# Patient Record
Sex: Female | Born: 1965 | Race: Black or African American | Hispanic: No | Smoking: Current some day smoker
Health system: Southern US, Community
[De-identification: ages and names within clinical notes are randomized; demographics above are authoritative.]

## PROBLEM LIST (undated history)

## (undated) DIAGNOSIS — M1990 Unspecified osteoarthritis, unspecified site: Secondary | ICD-10-CM

## (undated) DIAGNOSIS — I499 Cardiac arrhythmia, unspecified: Secondary | ICD-10-CM

## (undated) DIAGNOSIS — J302 Other seasonal allergic rhinitis: Secondary | ICD-10-CM

## (undated) DIAGNOSIS — M810 Age-related osteoporosis without current pathological fracture: Secondary | ICD-10-CM

## (undated) DIAGNOSIS — F32A Depression, unspecified: Secondary | ICD-10-CM

## (undated) DIAGNOSIS — K219 Gastro-esophageal reflux disease without esophagitis: Secondary | ICD-10-CM

## (undated) DIAGNOSIS — E785 Hyperlipidemia, unspecified: Secondary | ICD-10-CM

## (undated) DIAGNOSIS — K921 Melena: Secondary | ICD-10-CM

## (undated) DIAGNOSIS — I959 Hypotension, unspecified: Secondary | ICD-10-CM

## (undated) DIAGNOSIS — R29898 Other symptoms and signs involving the musculoskeletal system: Secondary | ICD-10-CM

## (undated) DIAGNOSIS — R011 Cardiac murmur, unspecified: Secondary | ICD-10-CM

## (undated) DIAGNOSIS — T7840XA Allergy, unspecified, initial encounter: Secondary | ICD-10-CM

## (undated) DIAGNOSIS — D649 Anemia, unspecified: Secondary | ICD-10-CM

## (undated) DIAGNOSIS — F419 Anxiety disorder, unspecified: Secondary | ICD-10-CM

## (undated) DIAGNOSIS — F329 Major depressive disorder, single episode, unspecified: Secondary | ICD-10-CM

## (undated) DIAGNOSIS — K649 Unspecified hemorrhoids: Secondary | ICD-10-CM

## (undated) DIAGNOSIS — G709 Myoneural disorder, unspecified: Secondary | ICD-10-CM

## (undated) DIAGNOSIS — F1910 Other psychoactive substance abuse, uncomplicated: Secondary | ICD-10-CM

## (undated) DIAGNOSIS — M419 Scoliosis, unspecified: Secondary | ICD-10-CM

## (undated) HISTORY — DX: Allergy, unspecified, initial encounter: T78.40XA

## (undated) HISTORY — DX: Scoliosis, unspecified: M41.9

## (undated) HISTORY — DX: Unspecified osteoarthritis, unspecified site: M19.90

## (undated) HISTORY — DX: Other seasonal allergic rhinitis: J30.2

## (undated) HISTORY — DX: Gastro-esophageal reflux disease without esophagitis: K21.9

## (undated) HISTORY — DX: Anxiety disorder, unspecified: F41.9

## (undated) HISTORY — DX: Anemia, unspecified: D64.9

## (undated) HISTORY — DX: Myoneural disorder, unspecified: G70.9

## (undated) HISTORY — DX: Other psychoactive substance abuse, uncomplicated: F19.10

## (undated) HISTORY — DX: Melena: K92.1

## (undated) HISTORY — DX: Unspecified hemorrhoids: K64.9

## (undated) HISTORY — PX: WISDOM TOOTH EXTRACTION: SHX21

## (undated) HISTORY — DX: Hyperlipidemia, unspecified: E78.5

## (undated) HISTORY — DX: Depression, unspecified: F32.A

## (undated) HISTORY — DX: Major depressive disorder, single episode, unspecified: F32.9

## (undated) HISTORY — PX: TUBAL LIGATION: SHX77

## (undated) HISTORY — PX: PATELLA FRACTURE SURGERY: SHX735

## (undated) HISTORY — DX: Cardiac arrhythmia, unspecified: I49.9

## (undated) HISTORY — DX: Hypotension, unspecified: I95.9

## (undated) HISTORY — DX: Cardiac murmur, unspecified: R01.1

## (undated) HISTORY — DX: Age-related osteoporosis without current pathological fracture: M81.0

## (undated) HISTORY — PX: REPLACEMENT TOTAL HIP W/  RESURFACING IMPLANTS: SUR1222

---

## 2004-03-23 ENCOUNTER — Encounter (HOSPITAL_COMMUNITY): Admission: RE | Admit: 2004-03-23 | Discharge: 2004-04-22 | Payer: Self-pay | Admitting: Preventative Medicine

## 2004-11-27 ENCOUNTER — Emergency Department (HOSPITAL_COMMUNITY): Admission: EM | Admit: 2004-11-27 | Discharge: 2004-11-27 | Payer: Self-pay | Admitting: Emergency Medicine

## 2008-01-28 ENCOUNTER — Emergency Department (HOSPITAL_COMMUNITY): Admission: EM | Admit: 2008-01-28 | Discharge: 2008-01-28 | Payer: Self-pay | Admitting: Emergency Medicine

## 2008-06-08 ENCOUNTER — Emergency Department (HOSPITAL_COMMUNITY): Admission: EM | Admit: 2008-06-08 | Discharge: 2008-06-08 | Payer: Self-pay | Admitting: Emergency Medicine

## 2008-06-16 ENCOUNTER — Emergency Department (HOSPITAL_COMMUNITY): Admission: EM | Admit: 2008-06-16 | Discharge: 2008-06-16 | Payer: Self-pay | Admitting: Emergency Medicine

## 2009-08-18 ENCOUNTER — Emergency Department (HOSPITAL_COMMUNITY): Admission: EM | Admit: 2009-08-18 | Discharge: 2009-08-18 | Payer: Self-pay | Admitting: Family Medicine

## 2010-03-03 ENCOUNTER — Emergency Department (HOSPITAL_COMMUNITY): Admission: EM | Admit: 2010-03-03 | Discharge: 2010-03-03 | Payer: Self-pay | Admitting: Family Medicine

## 2010-04-05 ENCOUNTER — Emergency Department (HOSPITAL_COMMUNITY): Admission: EM | Admit: 2010-04-05 | Discharge: 2010-04-05 | Payer: Self-pay | Admitting: Emergency Medicine

## 2010-06-14 ENCOUNTER — Emergency Department (HOSPITAL_COMMUNITY): Admission: EM | Admit: 2010-06-14 | Discharge: 2010-06-14 | Payer: Self-pay | Admitting: Family Medicine

## 2011-05-23 LAB — POCT RAPID STREP A: Streptococcus, Group A Screen (Direct): NEGATIVE

## 2013-06-17 ENCOUNTER — Emergency Department (INDEPENDENT_AMBULATORY_CARE_PROVIDER_SITE_OTHER)
Admission: EM | Admit: 2013-06-17 | Discharge: 2013-06-17 | Disposition: A | Discharge status: Home / Self Care | Payer: Self-pay | Source: Home / Self Care | Attending: Emergency Medicine | Admitting: Emergency Medicine

## 2013-06-17 ENCOUNTER — Encounter (HOSPITAL_COMMUNITY): Payer: Self-pay | Admitting: Emergency Medicine

## 2013-06-17 DIAGNOSIS — S139XXA Sprain of joints and ligaments of unspecified parts of neck, initial encounter: Secondary | ICD-10-CM

## 2013-06-17 DIAGNOSIS — S161XXA Strain of muscle, fascia and tendon at neck level, initial encounter: Secondary | ICD-10-CM

## 2013-06-17 DIAGNOSIS — S39012A Strain of muscle, fascia and tendon of lower back, initial encounter: Secondary | ICD-10-CM

## 2013-06-17 HISTORY — DX: Other symptoms and signs involving the musculoskeletal system: R29.898

## 2013-06-17 MED ORDER — OXYCODONE-ACETAMINOPHEN 5-325 MG PO TABS: ORAL_TABLET | ORAL | Status: DC

## 2013-06-17 MED ORDER — MELOXICAM 15 MG PO TABS: 15.0000 mg | ORAL_TABLET | Freq: Every day | ORAL | Status: DC

## 2013-06-17 MED ORDER — METHOCARBAMOL 500 MG PO TABS: 500.0000 mg | ORAL_TABLET | Freq: Three times a day (TID) | ORAL | Status: DC
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
## 2014-02-22 ENCOUNTER — Emergency Department (HOSPITAL_COMMUNITY)
Admission: EM | Admit: 2014-02-22 | Discharge: 2014-02-22 | Disposition: A | Discharge status: Home / Self Care | Payer: BC Managed Care – PPO | Attending: Emergency Medicine | Admitting: Emergency Medicine

## 2014-02-22 ENCOUNTER — Encounter (HOSPITAL_COMMUNITY): Payer: Self-pay | Admitting: Emergency Medicine

## 2014-02-22 DIAGNOSIS — M79609 Pain in unspecified limb: Secondary | ICD-10-CM | POA: Insufficient documentation

## 2014-02-22 DIAGNOSIS — M79671 Pain in right foot: Secondary | ICD-10-CM

## 2014-02-22 DIAGNOSIS — Z791 Long term (current) use of non-steroidal anti-inflammatories (NSAID): Secondary | ICD-10-CM | POA: Insufficient documentation

## 2014-02-22 DIAGNOSIS — F17200 Nicotine dependence, unspecified, uncomplicated: Secondary | ICD-10-CM | POA: Insufficient documentation

## 2014-02-22 DIAGNOSIS — Z79899 Other long term (current) drug therapy: Secondary | ICD-10-CM | POA: Insufficient documentation

## 2014-02-22 MED ORDER — NAPROXEN 500 MG PO TABS: 500.0000 mg | ORAL_TABLET | Freq: Two times a day (BID) | ORAL | Status: DC

## 2014-02-22 NOTE — ED Provider Notes (Signed)
CSN: [id number]     Arrival date & time 02/22/14  1405 History   First MD Initiated Contact with Patient 02/22/14 1608     Chief Complaint  Patient presents with  . Foot Pain   HPI  Toni Kennedy is a 48 y.o. female with a PMH of CHF who presents to the ED for evaluation of right foot pain. History was provided by the patient. Patient states she has had right foot pain for the past 2-3 weeks. Pain is present on the right lateral foot. Patient went to an UC and had an x-ray which showed a bone spur and flat arches. Pain is a constant sharp pain, which is worse with ambulation. Patient was prescribed robaxin which has not been relieving her pain. She states she has had localized intermittent swelling to the area, but denies this currently. She was given a boot, but has only been wearing it intermittently. Patient denies any injuries or trauma. No numbness, tingling, loss of sensation, or weakness. No calf pain/edema. No fevers, chills, SOB, chest pain or other concerns. Patient has an orthopedic specialist Dr. Alfonso Ramus. Hx of foot injury on the left. No hx of injury on the right.     Past Medical History  Diagnosis Date  . Back complaints    History reviewed. No pertinent past surgical history. No family history on file. History  Substance Use Topics  . Smoking status: Current Every Day Smoker    Types: Cigars  . Smokeless tobacco: Not on file  . Alcohol Use: No   OB History   Grav Para Term Preterm Abortions TAB SAB Ect Mult Living                  Review of Systems  Constitutional: Negative for fever, chills, activity change, appetite change and fatigue.  Respiratory: Negative for shortness of breath.   Cardiovascular: Negative for chest pain and leg swelling.  Musculoskeletal: Positive for arthralgias (foot pain) and joint swelling (see HPI). Negative for back pain, gait problem and neck pain.  Skin: Negative for color change and wound.  Neurological: Negative for weakness and  numbness.    Allergies  Sulfa antibiotics and Tetracyclines & related  Home Medications   Prior to Admission medications   Medication Sig Start Date End Date Taking? Authorizing Provider  meloxicam (MOBIC) 15 MG tablet Take 1 tablet (15 mg total) by mouth daily. 06/17/13   Harden Mo, MD  methocarbamol (ROBAXIN) 500 MG tablet Take 1 tablet (500 mg total) by mouth 3 (three) times daily. 06/17/13   Harden Mo, MD  oxyCODONE-acetaminophen (PERCOCET) 5-325 MG per tablet 1 to 2 tablets every 6 hours as needed for pain. 06/17/13   Harden Mo, MD   BP 127/74  Pulse 82  Temp(Src) 98.4 F (36.9 C) (Oral)  Resp 20  SpO2 99%  LMP 02/01/2014  Filed Vitals:   02/22/14 1411 02/22/14 1633  BP: 127/74 126/64  Pulse: 82 60  Temp: 98.4 F (36.9 C) 98.5 F (36.9 C)  TempSrc: Oral Oral  Resp: 20 18  SpO2: 99% 100%    Physical Exam  Nursing note and vitals reviewed. Constitutional: She is oriented to person, place, and time. She appears well-developed and well-nourished. No distress.  HENT:  Head: Normocephalic and atraumatic.  Right Ear: External ear normal.  Left Ear: External ear normal.  Nose: Nose normal.  Mouth/Throat: Oropharynx is clear and moist.  Eyes: Conjunctivae are normal. Right eye exhibits no discharge. Left  eye exhibits no discharge.  Neck: Normal range of motion. Neck supple.  Cardiovascular: Normal rate, regular rhythm, normal heart sounds and intact distal pulses.  Exam reveals no gallop and no friction rub.   No murmur heard. Dorsalis pedis pulses present and equal bilaterally  Pulmonary/Chest: Effort normal and breath sounds normal. No respiratory distress. She has no wheezes. She has no rales. She exhibits no tenderness.  Abdominal: Soft.  Musculoskeletal: Normal range of motion. She exhibits no edema and no tenderness.  No tenderness to palpation to the right foot or ankle throughout. Patient reports pain to her right lateral foot with weight bearing  and ambulation. Pes planus. Hallux valgus on the left. No edema to the feet, calves or legs bilaterally. No wounds, erythema, ecchymosis to the feet or LE throughout. ROM intact in the LE. Patient able to ambulate without difficulty or ataxia  Neurological: She is alert and oriented to person, place, and time.  Sensation intact in the LE bilaterally  Skin: Skin is warm and dry. She is not diaphoretic.     ED Course  Procedures (including critical care time) Labs Review Labs Reviewed - No data to display  Imaging Review No results found.   EKG Interpretation None      MDM   Toni Kennedy is a 48 y.o. female with a PMH of CHF who presents to the ED for evaluation of right foot pain. Foot pain possibly due to bone spur vs arthritis vs pes planus. Patient had x-rays of her foot done PTA at an UC clinic. No hx of injuries or trauma. Also given boot PTA at [facility] clinic. Patient wearing flip flops in the ED. Advised to wear better supportive shoes and or boot. Patient neurovascularly intact. No evidence of an infectious process. Patient afebrile and non-toxic in appearance. RICE method discussed. Advised patient to follow-up with her orthopedic specialist. Return precautions, discharge instructions, and follow-up was discussed with the patient before discharge.      Discharge Medication List as of 02/22/2014  4:17 PM    START taking these medications   Details  naproxen (NAPROSYN) 500 MG tablet Take 1 tablet (500 mg total) by mouth 2 (two) times daily with a meal., Starting 02/22/2014, Until Discontinued, Print         Final impressions: 1. Right foot pain       Harold Hedge [place], [place] 02/23/14 [phone]

## 2014-02-22 NOTE — Discharge Instructions (Signed)
Use RICE method - see below  Take naprosyn twice daily with food for pain Continue to wear your boot  Return to the emergency department if you develop any changing/worsening condition, fever, leg swelling, spreading redness/swelling or any other concerns (please read additional information regarding your condition below)    Foot Pain  A foot contusion is a deep bruise to the foot. Contusions are the result of an injury that caused bleeding under the skin. The contusion may turn blue, purple, or yellow. Minor injuries will give you a painless contusion, but more severe contusions may stay painful and swollen for a few weeks. CAUSES  A foot contusion comes from a direct blow to that area, such as a heavy object falling on the foot. SYMPTOMS   Swelling of the foot.  Discoloration of the foot.  Tenderness or soreness of the foot. DIAGNOSIS  You will have a physical exam and will be asked about your history. You may need an X-ray of your foot to look for a broken bone (fracture).  TREATMENT  An elastic wrap may be recommended to support your foot. Resting, elevating, and applying cold compresses to your foot are often the best treatments for a foot contusion. Over-the-counter medicines may also be recommended for pain control. HOME CARE INSTRUCTIONS   Put ice on the injured area.  Put ice in a plastic bag.  Place a towel between your skin and the bag.  Leave the ice on for 15-20 minutes, 03-04 times a day.  Only take over-the-counter or prescription medicines for pain, discomfort, or fever as directed by your caregiver.  If told, use an elastic wrap as directed. This can help reduce swelling. You may remove the wrap for sleeping, showering, and bathing. If your toes become numb, cold, or blue, take the wrap off and reapply it more loosely.  Elevate your foot with pillows to reduce swelling.  Try to avoid standing or walking while the foot is painful. Do not resume use until  instructed by your caregiver. Then, begin use gradually. If pain develops, decrease use. Gradually increase activities that do not cause discomfort until you have normal use of your foot.  See your caregiver as directed. It is very important to keep all follow-up appointments in order to avoid any lasting problems with your foot, including long-term (chronic) pain. SEEK IMMEDIATE MEDICAL CARE IF:   You have increased redness, swelling, or pain in your foot.  Your swelling or pain is not relieved with medicines.  You have loss of feeling in your foot or are unable to move your toes.  Your foot turns cold or blue.  You have pain when you move your toes.  Your foot becomes warm to the touch.  Your contusion does not improve in 2 days. MAKE SURE YOU:   Understand these instructions.  Will watch your condition.  Will get help right away if you are not doing well or get worse. Document Released: 05/29/2006 Document Revised: 02/06/2012 Document Reviewed: 07/11/2011 [facility] Patient Information 2015 [place], [place]. This information is not intended to replace advice given to you by your health care provider. Make sure you discuss any questions you have with your health care provider.  RICE: Routine Care for Injuries The routine care of many injuries includes Rest, Ice, Compression, and Elevation (RICE). HOME CARE INSTRUCTIONS  Rest is needed to allow your body to heal. Routine activities can usually be resumed when comfortable. Injured tendons and bones can take up to 6 weeks to  heal. Tendons are the cord-like structures that attach muscle to bone.  Ice following an injury helps keep the swelling down and reduces pain.  Put ice in a plastic bag.  Place a towel between your skin and the bag.  Leave the ice on for 15-20 minutes, 3-4 times a day, or as directed by your health care provider. Do this while awake, for the first 24 to 48 hours. After that, continue as directed by your  caregiver.  Compression helps keep swelling down. It also gives support and helps with discomfort. If an elastic bandage has been applied, it should be removed and reapplied every 3 to 4 hours. It should not be applied tightly, but firmly enough to keep swelling down. Watch fingers or toes for swelling, bluish discoloration, coldness, numbness, or excessive pain. If any of these problems occur, remove the bandage and reapply loosely. Contact your caregiver if these problems continue.  Elevation helps reduce swelling and decreases pain. With extremities, such as the arms, hands, legs, and feet, the injured area should be placed near or above the level of the heart, if possible. SEEK IMMEDIATE MEDICAL CARE IF:  You have persistent pain and swelling.  You develop redness, numbness, or unexpected weakness.  Your symptoms are getting worse rather than improving after several days. These symptoms may indicate that further evaluation or further X-rays are needed. Sometimes, X-rays may not show a small broken bone (fracture) until 1 week or 10 days later. Make a follow-up appointment with your caregiver. Ask when your X-ray results will be ready. Make sure you get your X-ray results. Document Released: 11/19/2000 Document Revised: 08/12/2013 Document Reviewed: 01/06/2011 [facility] Patient Information 2015 [place], [place]. This information is not intended to replace advice given to you by your health care provider. Make sure you discuss any questions you have with your health care provider.  Flat Feet Having flat feet is a common condition. One foot or both might be affected. People of any age can have flat feet. In fact, everyone is born with them. But most of the time, the foot gradually develops an arch. That is the curve on the bottom of the foot that creates a gap between the foot and the ground. An arch usually develops in childhood. Sometimes, though, an arch never develops and the foot stays flat on  the bottom. Other times, an arch develops but later collapses (caves in). That is what gives the condition its nickname, "fallen arches." The medical term for flat feet is pes planus. Some people have flat feet their whole life and have no problems. For others, the condition causes pain and needs to be corrected.  CAUSES   A problem with the foot's soft tissue; tendons and ligaments could be loose.  This can cause what is called flexible flat feet. That means the shape of the foot changes with pressure. When standing on the toes, a curved arch can be seen. When standing on the ground, the foot is flat.  Wear and tear. Sometimes arches simply flatten over time.  Damage to the posterior tibial tendon. This is the tendon that goes from the inside of the ankle to the bones in the middle of the foot. It is the main support for the arch. If the tendon is injured, stretched or torn, the arch might flatten.  Tarsal coalition. With this condition, two or more bones in the foot are joined together (fused ) during development in the womb. This limits movement and can lead to  a flat foot. SYMPTOMS   The foot is even with the ground from toe to heel. Your caregiver will look closely at the inside of the foot while you are standing.  Pain along the bottom of the foot. Some people describe the pain as tightness.  Swelling on the inside of the foot or ankle.  Changes in the way you walk (gait).  The feet lean inward, starting at the ankle (pronation). DIAGNOSIS  To decide if a child or adult has flat feet, a healthcare provider will probably:  Do a physical examination. This might include having the person stand on his or her toes and then stand normally. The caregiver will also hold the foot and put pressure on the foot in different directions.  Check the person's shoes. The pattern of wear on the soles can offer clues.  Order images (pictures) of the foot. They can help identify the cause of any  pain. They also will show injuries to bones or tendons that could be causing the condition. The images can come from:  X-rays.  Computed tomography (CT) scan. This combines X-ray and a computer.  Magnetic resonance imaging (MRI). This uses magnets, radio waves and a computer to take a picture of the foot. It is the best technique to evaluate tendons, ligaments and muscles. TREATMENT   Flexible flat feet usually are painless. Most of the time, gait is not affected. Most children grow out of the condition. Often no treatment is needed. If there is pain, treatment options include:  Orthotics. These are inserts that go in the shoes. They add support and shape to the feet. An orthotic is custom-made from a mold of the foot.  Shoes. Not all shoes are the same. People with flat feet need arch support. However, too much can be painful. It is important to find shoes that offer the right amount of support. Athletes, especially runners, may need to try shoes made just for people with flatter feet.  Medication. For pain, only take over-the-counter medicine for pain, discomfort, as directed by your caregiver.  Rest. If the feet start to hurt, cut back on the exercise which increases the pain. Use common sense.  For damage to the posterior tibial tendon, options include:  Orthotics. Also adding a wedge on the inside edge may help. This can relieve pressure on the tendon.  Ankle brace, boot or cast. These supports can ease the load on the tendon while it heals.  Surgery. If the tendon is torn, it might need to be repaired.  For tarsal coalition, similar options apply:  Pain medication.  Orthotics.  A cast and crutches. This keeps weight off the foot.  Physical therapy.  Surgery to remove the bone bridge joining the two bones together. PROGNOSIS  In most people, flat feet do not cause pain or problems. People can go about their normal activities. However, if flat feet are painful, they can and  should be treated. Treatment usually relieves the pain. HOME CARE INSTRUCTIONS   Take any medications prescribed by the healthcare provider. Follow the directions carefully.  Wear, or make sure a child wears, orthotics or special shoes if this was suggested. Be sure to ask how often and for how long they should be worn.  Do any exercises or therapy treatments that were suggested.  Take notes on when the pain occurs. This will help healthcare providers decide how to treat the condition.  If surgery is needed, be sure to find out if there is anything  that should or should not be done before the operation. SEEK MEDICAL CARE IF:   Pain worsens in the foot or lower leg.  Pain disappears after treatment, but then returns.  Walking or simple exercise becomes difficult or causes foot pain.  Orthotics or special shoes are uncomfortable or painful. Document Released: 06/04/2009 Document Revised: 10/30/2011 Document Reviewed: 06/04/2009 [facility] Patient Information 2015 [place], [place]. This information is not intended to replace advice given to you by your health care provider. Make sure you discuss any questions you have with your health care provider.  Bunion (Hallux Valgus) A bony bump (protrusion) on the inside of the foot, at the base of the first toe, is called a bunion (hallux valgus). A bunion causes the first toe to angle toward the other toes. SYMPTOMS   A bony bump on the inside of the foot, causing an outward turning of the first toe. It may also overlap the second toe.  Thickening of the skin (callus) over the bony bump.  Fluid buildup under the callus. Fluid may become red, tender, and swollen (inflamed) with constant irritation or pressure.  Foot pain and stiffness. CAUSES  Many causes exist, including:  Inherited from your family (genetics).  Injury (trauma) forcing the first toe into a position in which it overlaps other toes.  Bunions are also associated with wearing  shoes that have a narrow toe box (pointy shoes). RISK INCREASES WITH:  Family history of foot abnormalities, especially bunions.  Arthritis.  Narrow shoes, especially high heels. PREVENTION  Wear shoes with a wide toe box.  Avoid shoes with high heels.  Wear a small pad between the big toe and second toe.  Maintain proper conditioning:  Foot and ankle flexibility.  Muscle strength and endurance. PROGNOSIS  With proper treatment, bunions can typically be cured. Occasionally, surgery is required.  RELATED COMPLICATIONS   Infection of the bunion.  Arthritis of the first toe.  Risks of surgery, including infection, bleeding, injury to nerves (numb toe), recurrent bunion, overcorrection (toe points inward), arthritis of the big toe, big toe pointing upward, and bone not healing. TREATMENT  Treatment first consists of stopping the activities that aggravate the pain, taking pain medicines, and icing to reduce inflammation and pain. Wear shoes with a wide toe box. Shoes can be modified by a shoe repair person to relieve pressure on the bunion, especially if you cannot find shoes with a wide enough toe box. You may also place a pad with the center cut out in your shoe, to reduce pressure on the bunion. Sometimes, an arch support (orthotic) may reduce pressure on the bunion and alleviate the symptoms. Stretching and strengthening exercises for the muscles of the foot may be useful. You may choose to wear a brace or pad at night to hold the big toe away from the second toe. If non-surgical treatments are not successful, surgery may be needed. Surgery involves removing the overgrown tissue and correcting the position of the first toe, by realigning the bones. Bunion surgery is typically performed on an outpatient basis, meaning you can go home the same day as surgery. The surgery may involve cutting the mid portion of the bone of the first toe, or just cutting and repairing (reconstructing) the  ligaments and soft tissues around the first toe.  MEDICATION   If pain medicine is needed, nonsteroidal anti-inflammatory medicines, such as aspirin and ibuprofen, or other minor pain relievers, such as acetaminophen, are often recommended.  Do not take pain medicine for  7 days before surgery.  Prescription pain relievers are usually only prescribed after surgery. Use only as directed and only as much as you need.  Ointments applied to the skin may be helpful. HEAT AND COLD  Cold treatment (icing) relieves pain and reduces inflammation. Cold treatment should be applied for 10 to 15 minutes every 2 to 3 hours for inflammation and pain and immediately after any activity that aggravates your symptoms. Use ice packs or an ice massage.  Heat treatment may be used prior to performing the stretching and strengthening activities prescribed by your caregiver, physical therapist, or athletic trainer. Use a heat pack or a warm soak. SEEK MEDICAL CARE IF:   Symptoms get worse or do not improve in 2 weeks, despite treatment.  After surgery, you develop fever, increasing pain, redness, swelling, drainage of fluids, bleeding, or increasing warmth around the surgical area.  New, unexplained symptoms develop. (Drugs used in treatment may produce side effects.) Document Released: 08/07/2005 Document Revised: 10/30/2011 Document Reviewed: 11/19/2008 [facility] Patient Information 2015 [place], [place]. This information is not intended to replace advice given to you by your health care provider. Make sure you discuss any questions you have with your health care provider.

## 2014-02-22 NOTE — ED Notes (Signed)
Pt.stated, the last 2-3 weeks my rt. Foot has been painful.Toni Kennedy saw Toni Justin, PA and given Melexican and not helping. They said its bone spurs and the arch in my foot. Given me a foot boot.

## 2014-02-26 NOTE — ED Provider Notes (Signed)
  Medical screening examination/treatment/procedure(s) were performed by non-physician practitioner and as supervising physician I was immediately available for consultation/collaboration.   EKG Interpretation None         Carmin Muskrat, MD 02/26/14 [phone]

## 2014-05-07 ENCOUNTER — Emergency Department (INDEPENDENT_AMBULATORY_CARE_PROVIDER_SITE_OTHER)
Admission: EM | Admit: 2014-05-07 | Discharge: 2014-05-07 | Disposition: A | Discharge status: Home / Self Care | Payer: BC Managed Care – PPO | Source: Home / Self Care

## 2014-05-07 ENCOUNTER — Encounter (HOSPITAL_COMMUNITY): Payer: Self-pay | Admitting: Emergency Medicine

## 2014-05-07 DIAGNOSIS — H0013 Chalazion right eye, unspecified eyelid: Secondary | ICD-10-CM

## 2014-05-07 DIAGNOSIS — H0019 Chalazion unspecified eye, unspecified eyelid: Secondary | ICD-10-CM

## 2014-05-07 MED ORDER — ERYTHROMYCIN 5 MG/GM OP OINT: TOPICAL_OINTMENT | OPHTHALMIC | Status: DC

## 2014-05-07 NOTE — Discharge Instructions (Signed)
Chalazion A chalazion is a swelling or hard lump on the eyelid caused by a blocked oil gland. Chalazions may occur on the upper or the lower eyelid.  CAUSES  Oil gland in the eyelid becomes blocked. SYMPTOMS   Swelling or hard lump on the eyelid. This lump may make it hard to see out of the eye.  The swelling may spread to areas around the eye. TREATMENT   Although some chalazions disappear by themselves in 1 or 2 months, some chalazions may need to be removed.  Medicines to treat an infection may be required. HOME CARE INSTRUCTIONS   Wash your hands often and dry them with a clean towel. Do not touch the chalazion.  Apply heat to the eyelid several times a day for 10 minutes to help ease discomfort and bring any yellowish white fluid (pus) to the surface. One way to apply heat to a chalazion is to use the handle of a metal spoon.  Hold the handle under hot water until it is hot, and then wrap the handle in paper towels so that the heat can come through without burning your skin.  Hold the wrapped handle against the chalazion and reheat the spoon handle as needed.  Apply heat in this fashion for 10 minutes, 4 times per day.  Return to your caregiver to have the pus removed if it does not break (rupture) on its own.  Do not try to remove the pus yourself by squeezing the chalazion or sticking it with a pin or needle.  Only take over-the-counter or prescription medicines for pain, discomfort, or fever as directed by your caregiver. SEEK IMMEDIATE MEDICAL CARE IF:   You have pain in your eye.  Your vision changes.  The chalazion does not go away.  The chalazion becomes painful, red, or swollen, grows larger, or does not start to disappear after 2 weeks. MAKE SURE YOU:   Understand these instructions.  Will watch your condition.  Will get help right away if you are not doing well or get worse. Document Released: 08/04/2000 Document Revised: 10/30/2011 Document Reviewed:  11/22/2009 [employer] Patient Information 2015 [employer]. This information is not intended to replace advice given to you by your health care provider. Make sure you discuss any questions you have with your health care provider.  

## 2014-05-07 NOTE — ED Notes (Signed)
Reports 3 days ago, she thought she got something in eye, rubbed eye a lot.  Since then, she has noticed a bump on lower, right lid.  Patient reports pulling lid down and seeing a yellow pocket, reports on the way here bump did burst.  Bump remains visible below right eye

## 2014-05-07 NOTE — ED Provider Notes (Signed)
CSN: [id number]     Arrival date & time 05/07/14  1009 History   First MD Initiated Contact with Patient 05/07/14 1128     Chief Complaint  Patient presents with  . Eye Problem   (Consider location/radiation/quality/duration/timing/severity/associated sxs/prior Treatment) HPI Comments: 2-3 d ago experienced a FB sensation to the OD. She has been rubbing it and noticed a bump in the lower lid near the outer canthus.  She notes it temporarily drained prior to arrival to [facility].   Past Medical History  Diagnosis Date  . Back complaints    Past Surgical History  Procedure Laterality Date  . Cesarean section    . Tubal ligation    . Knee surgery     No family history on file. History  Substance Use Topics  . Smoking status: Current Every Day Smoker    Types: Cigars  . Smokeless tobacco: Not on file  . Alcohol Use: No   OB History   Grav Para Term Preterm Abortions TAB SAB Ect Mult Living                 Review of Systems  Constitutional: Negative.   HENT: Negative.   Eyes: Positive for pain. Negative for photophobia, discharge, redness and itching.  Respiratory: Negative.     Allergies  Sulfa antibiotics and Tetracyclines & related  Home Medications   Prior to Admission medications   Medication Sig Start Date End Date Taking? Authorizing Provider  meloxicam (MOBIC) 15 MG tablet Take 1 tablet (15 mg total) by mouth daily. 06/17/13   Harden Mo, MD  methocarbamol (ROBAXIN) 500 MG tablet Take 1 tablet (500 mg total) by mouth 3 (three) times daily. 06/17/13   Harden Mo, MD  naproxen (NAPROSYN) 500 MG tablet Take 1 tablet (500 mg total) by mouth 2 (two) times daily with a meal. 02/22/14   Lucila [place], PA-C  oxyCODONE-acetaminophen (PERCOCET) 5-325 MG per tablet 1 to 2 tablets every 6 hours as needed for pain. 06/17/13   Harden Mo, MD   BP 126/67  Pulse 76  Temp(Src) 98.6 F (37 C) (Oral)  Resp 16  SpO2 100%  LMP 04/23/2014 Physical Exam  Nursing note  and vitals reviewed. Constitutional: She is oriented to person, place, and time. She appears well-developed and well-nourished. No distress.  Eyes: EOM are normal. Pupils are equal, round, and reactive to light.  No significant conjunctival erythema. Scleral clear. There is a small raised, mounded area seen at the outer canthus of the lower lid. Exam of the upper and lower lid with eversion of upper lid reveals a small area of redness and the appearance of a yellow cat pf tissue consistent with a ruptured abscess like structure.  Within the lower lid conjunctiva at the outer canthus is a small area of erythema, tenderness and swelling which is the etiology of the external lesion.   Pulmonary/Chest: Effort normal.  Neurological: She is alert and oriented to person, place, and time.  Skin: Skin is warm and dry.    ED Course  Procedures (including critical care time) Labs Review Labs Reviewed - No data to display  Imaging Review No results found.   MDM   1. Chalazion, right    Wamr compresses Erythromycin oint OD       Janne Napoleon, NP 05/07/14 1159

## 2014-05-08 NOTE — ED Provider Notes (Signed)
Medical screening examination/treatment/procedure(s) were performed by a resident physician or non-physician practitioner and as the supervising physician I was immediately available for consultation/collaboration.  Lynne Leader, MD    Gregor Hams, MD 05/08/14 [phone]

## 2014-06-19 ENCOUNTER — Encounter (HOSPITAL_COMMUNITY): Payer: Self-pay | Admitting: Emergency Medicine

## 2014-06-19 ENCOUNTER — Emergency Department (HOSPITAL_COMMUNITY)
Admission: EM | Admit: 2014-06-19 | Discharge: 2014-06-19 | Disposition: A | Discharge status: Home / Self Care | Payer: BC Managed Care – PPO | Attending: Emergency Medicine | Admitting: Emergency Medicine

## 2014-06-19 ENCOUNTER — Other Ambulatory Visit: Payer: Self-pay

## 2014-06-19 DIAGNOSIS — Z791 Long term (current) use of non-steroidal anti-inflammatories (NSAID): Secondary | ICD-10-CM | POA: Diagnosis not present

## 2014-06-19 DIAGNOSIS — R531 Weakness: Secondary | ICD-10-CM | POA: Diagnosis not present

## 2014-06-19 DIAGNOSIS — Z720 Tobacco use: Secondary | ICD-10-CM | POA: Diagnosis not present

## 2014-06-19 DIAGNOSIS — R42 Dizziness and giddiness: Secondary | ICD-10-CM | POA: Diagnosis present

## 2014-06-19 LAB — I-STAT TROPONIN, ED: TROPONIN I, POC: 0.01 ng/mL (ref 0.00–0.08)

## 2014-06-19 LAB — BASIC METABOLIC PANEL
Anion gap: 11 (ref 5–15)
BUN: 8 mg/dL (ref 6–23)
CHLORIDE: 104 meq/L (ref 96–112)
CO2: 26 meq/L (ref 19–32)
CREATININE: 0.74 mg/dL (ref 0.50–1.10)
Calcium: 8.9 mg/dL (ref 8.4–10.5)
GFR calc Af Amer: >90 mL/min (ref >90)
GFR calc non Af Amer: >90 mL/min (ref >90)
Glucose, Bld: 104 mg/dL — ABNORMAL HIGH (ref 70–99)
Potassium: 3.7 mEq/L (ref 3.7–5.3)
Sodium: 141 mEq/L (ref 137–147)

## 2014-06-19 LAB — CBC
HCT: 32.9 % — ABNORMAL LOW (ref 36.0–46.0)
HEMOGLOBIN: 10 g/dL — AB (ref 12.0–15.0)
MCH: 23.5 pg — AB (ref 26.0–34.0)
MCHC: 30.4 g/dL (ref 30.0–36.0)
MCV: 77.2 fL — AB (ref 78.0–100.0)
PLATELETS: 290 10*3/uL (ref 150–400)
RBC: 4.26 MIL/uL (ref 3.87–5.11)
RDW: 16.8 % — ABNORMAL HIGH (ref 11.5–15.5)
WBC: 5.1 10*3/uL (ref 4.0–10.5)

## 2014-06-19 LAB — CBG MONITORING, ED: GLUCOSE-CAPILLARY: 117 mg/dL — AB (ref 70–99)

## 2014-06-19 MED ORDER — MECLIZINE HCL 25 MG PO TABS
25.0000 mg | ORAL_TABLET | Freq: Once | ORAL | Status: AC
  Administered 2014-06-19: 25 mg via ORAL
  Filled 2014-06-19: qty 1

## 2014-06-19 MED ORDER — MECLIZINE HCL 25 MG PO TABS: 25.0000 mg | ORAL_TABLET | Freq: Two times a day (BID) | ORAL | Status: DC | PRN

## 2014-06-19 NOTE — ED Notes (Signed)
Presents with dizziness that began 2-3 days ago and is intermittent, dizziness is made worse when tilting head up, occurs mostly at the end of the day and pt states, "IT feels like I have been drinking" Denies dizziness during the day. Dizziness is associated with a headache. Reports that "my mouth feels like steel and metal" alert and oriented, denies numbness and tingling. PERRLA, no facial droop or arm drift.

## 2014-06-19 NOTE — ED Provider Notes (Signed)
CSN: [id number]     Arrival date & time 06/19/14  0026 History   First MD Initiated Contact with Patient 06/19/14 0230     Chief Complaint  Patient presents with  . Dizziness     (Consider location/radiation/quality/duration/timing/severity/associated sxs/prior Treatment) HPI Toni Kennedy is a 48 y.o. female with no significant past medical history coming in with dizziness. Patient states this occurred approximately 3 days ago. She states she has been weak and lightheaded. She describes her dizziness as the room spinning. This is made worse at night when she lays her head back and turns left or right. She becomes nauseated without any weakness. During these episodes she denies any neurological complaints such as blurry vision, muscle weakness, numbness or tingling. She denies any pain anywhere. She also developed a metallic taste in her mouth. Patient states this has never happened to her previously. She has no headache or chest pain. Patient has no further complaints.  10 Systems reviewed and are negative for acute change except as noted in the HPI.   Past Medical History  Diagnosis Date  . Back complaints    Past Surgical History  Procedure Laterality Date  . Cesarean section    . Tubal ligation    . Knee surgery     History reviewed. No pertinent family history. History  Substance Use Topics  . Smoking status: Current Some Day Smoker    Types: Cigars  . Smokeless tobacco: Not on file  . Alcohol Use: No   OB History   Grav Para Term Preterm Abortions TAB SAB Ect Mult Living                 Review of Systems    Allergies  Sulfa antibiotics and Tetracyclines & related  Home Medications   Prior to Admission medications   Medication Sig Start Date End Date Taking? Authorizing Provider  diphenhydramine-acetaminophen (TYLENOL PM) 25-500 MG TABS Take 1-2 tablets by mouth at bedtime as needed (sleep).   Yes Historical Provider, MD  meloxicam (MOBIC) 15 MG tablet Take  15 mg by mouth daily.   Yes Historical Provider, MD  meclizine (ANTIVERT) 25 MG tablet Take 1 tablet (25 mg total) by mouth 2 (two) times daily as needed for dizziness. 06/19/14   Everlene Balls, MD   BP 122/70  Pulse 73  Temp(Src) 97.6 F (36.4 C) (Oral)  Resp 20  SpO2 99%  LMP 06/04/2014 Physical Exam  Nursing note and vitals reviewed. Constitutional: She is oriented to person, place, and time. She appears well-developed and well-nourished. No distress.  HENT:  Head: Normocephalic and atraumatic.  Nose: Nose normal.  Mouth/Throat: Oropharynx is clear and moist. No oropharyngeal exudate.  Eyes: Conjunctivae and EOM are normal. Pupils are equal, round, and reactive to light. No scleral icterus.  Neck: Normal range of motion. Neck supple. No JVD present. No tracheal deviation present. No thyromegaly present.  Cardiovascular: Normal rate, regular rhythm and normal heart sounds.  Exam reveals no gallop and no friction rub.   No murmur heard. Pulmonary/Chest: Effort normal and breath sounds normal. No respiratory distress. She has no wheezes. She exhibits no tenderness.  Abdominal: Soft. Bowel sounds are normal. She exhibits no distension and no mass. There is no tenderness. There is no rebound and no guarding.  Musculoskeletal: Normal range of motion. She exhibits no edema and no tenderness.  Lymphadenopathy:    She has no cervical adenopathy.  Neurological: She is alert and oriented to person, place, and time. No  cranial nerve deficit. She exhibits normal muscle tone.  5 out of 5 strength times four extremities. Normal sensation 4 extremities. Normal cerebellar testing. +Dix-Hallpike  Skin: Skin is warm and dry. No rash noted. She is not diaphoretic. No erythema. No pallor.    ED Course  Procedures (including critical care time) Labs Review Labs Reviewed  CBC - Abnormal; Notable for the following:    Hemoglobin 10.0 (*)    HCT 32.9 (*)    MCV 77.2 (*)    MCH 23.5 (*)    RDW 16.8  (*)    All other components within normal limits  BASIC METABOLIC PANEL - Abnormal; Notable for the following:    Glucose, Bld 104 (*)    All other components within normal limits  CBG MONITORING, ED - Abnormal; Notable for the following:    Glucose-Capillary 117 (*)    All other components within normal limits  I-STAT TROPOININ, ED    Imaging Review No results found.   EKG Interpretation None      MDM   Final diagnoses:  Dizziness   Patient presents to the ED for dizziness.  I have low concern for stroke in the patient and she has no other neurological symptoms with her dizziness, she is relatively young, and does not have any other major co-morbidities.  He symptoms are worsen when leaning her head back and turning side to side.  This could be vertigo.  EKG shows a short PR, but normal QRS and no delta wave.  No concern for WPW causing dizziness.  She was given meclizine and neurology follow up.  Rx was given as well.  PAtient is currently asymptomatic, she is well appearing, in no acute distress.  Her vital signs remain within her normal limits and she is safe for discharge.  Everlene Balls, MD 06/19/14 [phone]

## 2014-06-19 NOTE — Discharge Instructions (Signed)
Dizziness Toni Kennedy, he was seen today for dizziness. You may have vertigo as it is worse with movement. Take meclizine as needed for your symptoms. Follow-up with neurology for continued treatment of your dizziness. If any symptoms worsen come back to the emergency department immediately for repeat evaluation. Thank you.  Dizziness means you feel unsteady or lightheaded. You might feel like you are going to pass out (faint). HOME CARE   Drink enough fluids to keep your pee (urine) clear or pale yellow.  Take your medicines exactly as told by your doctor. If you take blood pressure medicine, always stand up slowly from the lying or sitting position. Hold on to something to steady yourself.  If you need to stand in one place for a long time, move your legs often. Tighten and relax your leg muscles.  Have someone stay with you until you feel okay.  Do not drive or use heavy machinery if you feel dizzy.  Do not drink alcohol. GET HELP RIGHT AWAY IF:   You feel dizzy or lightheaded and it gets worse.  You feel sick to your stomach (nauseous), or you throw up (vomit).  You have trouble talking or walking.  You feel weak or have trouble using your arms, hands, or legs.  You cannot think clearly or have trouble forming sentences.  You have chest pain, belly (abdominal) pain, sweating, or you are short of breath.  Your vision changes.  You are bleeding.  You have problems from your medicine that seem to be getting worse. MAKE SURE YOU:   Understand these instructions.  Will watch your condition.  Will get help right away if you are not doing well or get worse. Document Released: 07/27/2011 Document Revised: 10/30/2011 Document Reviewed: 07/27/2011 [facility] Patient Information 2015 [place], [place]. This information is not intended to replace advice given to you by your health care provider. Make sure you discuss any questions you have with your health care provider.  Emergency  Department Resource Guide 1) Find a Doctor and Pay Out of Pocket Although you won't have to find out who is covered by your insurance plan, it is a good idea to ask around and get recommendations. You will then need to call the office and see if the doctor you have chosen will accept you as a new patient and what types of options they offer for patients who are self-pay. Some doctors offer discounts or will set up payment plans for their patients who do not have insurance, but you will need to ask so you aren't surprised when you get to your appointment.  2) Contact Your Local Health Department Not all health departments have doctors that can see patients for sick visits, but many do, so it is worth a call to see if yours does. If you don't know where your local health department is, you can check in your phone book. The CDC also has a tool to help you locate your state's health department, and many state websites also have listings of all of their local health departments.  3) Find a [facility] If your illness is not likely to be very severe or complicated, you may want to try a walk in clinic. These are popping up all over the country in pharmacies, drugstores, and shopping centers. They're usually staffed by nurse practitioners or physician assistants that have been trained to treat common illnesses and complaints. They're usually fairly quick and inexpensive. However, if you have serious medical issues or chronic medical  problems, these are probably not your best option.  No Primary Care Doctor: - Call Health Connect at  [phone] - they can help you locate a primary care doctor that  accepts your insurance, provides certain services, etc. - Physician Referral Service- [phone]  Chronic Pain Problems: Organization         Address  Phone   Notes  [facility]  [phone] Patients need to be referred by their primary care doctor.   Medication  Assistance: Organization         Address  Phone   Notes  [facility] Medication [facility] [address] [place], [place] [address] [phone] --Must be a resident of [facility] -- Must have NO insurance coverage whatsoever (no Medicaid/ Medicare, etc.) -- The pt. MUST have a primary care doctor that directs their care regularly and follows them in the community   MedAssist  [phone]   [employer]  [phone]    Agencies that provide inexpensive medical care: Organization         Address  Phone   Notes  [place]  [phone]   [facility]    [phone]   [facility] [place], [place] [address] [phone]   [place] [address], [place] [phone]   Planned Parenthood    [phone]   [facility]    [phone]   [place] and [place] [address], [place] Phone:  [phone], Fax:  [phone] Hours of Operation:  9 am - 6 pm, M-F.  Also accepts Medicaid/Medicare and self-pay.  [facility] [place], [address], [place] Phone: [phone], Fax: [phone]. Hours of Operation:  8:30 am - 5:30 pm, M-F.  Also accepts Medicaid and self-pay.  [facility] [address], [place] Phone: [phone]   [place], [place], [place] [phone], Ext. [phone] Mondays & Thursdays: 7-9 AM.  First 15 patients are seen on a first come, first serve basis.    [place] Providers:  Organization         Address  Phone   Notes  [facility] [address], [place] [phone] Also accepts self-pay patients.  [facility] [address], [place]  [phone]   [place], [address], [place]  [phone]   [facility] [address], [place] [phone]   Lucianne Lei [address], [place]   [phone] Only accepts [place] Access [place] patients after they have their name applied to their card.   Self-Pay (no insurance) in [facility]:  Organization         Address  Phone   Notes  Sickle Cell Patients, [facility] [place] [phone]   [facility] Urgent Care [place] [phone]   [facility] [place]  [place], [place],  [phone]   [facility]/Dr. Osei-Bonsu  [address] [place] or [address], [place] [phone] Phone number for both [place] and [place] locations is the same.  Urgent Medical  and [facility] [address] [place] [phone]   [facility] [address] [place] or [address] [phone]   [facility] [address] [place] [phone], phone; [phone], fax Sees patients 1st and 3rd Saturday of every month.  Must not qualify for public or private insurance (i.e. Medicaid, Medicare, [place] Health Choice, Veterans' Benefits)  Household income should be no more than 200% of the poverty level The clinic cannot treat you if you are pregnant or think you are pregnant  Sexually transmitted diseases are not treated at the clinic.    Dental Care: Organization         Address  Phone  Notes  [facility] Department of [facility] [place] [phone] Accepts children up to age 53 who are enrolled in [place] or [place]; pregnant women with a Medicaid card; and children who have applied for Medicaid or [place] Health Choice, but were declined, whose parents can pay a reduced fee at time of service.  [facility]  Department of [facility]  [address], [place] [phone] Accepts children up to age 36 who are enrolled in [place] or [place]; pregnant women with a Medicaid card; and children who have applied for Medicaid or [place] Health Choice, but were declined, whose parents can pay a reduced fee at time of service.  [place] Adult Dental Access PROGRAM  [place] [phone] Patients are seen by appointment only. Walk-ins are not accepted. [place] will see patients 31 years of age and older. Monday - Tuesday (8am-5pm) Most Wednesdays (8:30-5pm) $30 per visit, cash only  [facility] Adult Dental Access PROGRAM  [address], [facility] [phone] Patients are seen by appointment only. Walk-ins are not accepted. [place] will see patients 26 years of age and older. One Wednesday Evening (Monthly: Volunteer Based).  $30 per visit, cash only  [place]  [phone] for adults; Children under age 55, call Graduate Pediatric Dentistry at [phone]. Children aged 13-14, please call [phone] to request a pediatric application.  Dental services are provided in all areas of dental care including fillings, crowns and bridges, complete and partial dentures, implants, gum treatment, root canals, and extractions. Preventive care is also provided. Treatment is provided to both adults and children. Patients are selected via a lottery and there is often a waiting list.   [facility] [address], [place]  [phone] [email or web address]   [facility] [address] [place], [place] [phone], Ext. [phone] Second and Fourth Thursday of each month, opens at 6:30 AM; Clinic ends at 9 AM.  Patients are seen on a first-come first-served basis, and a limited number are seen during each clinic.   [facility]  [address] Hillard Danker [place], [place] [phone]    Eligibility Requirements You must have lived in [place], [place], or [place] counties for at least the last three months.   You cannot be eligible for state or federal sponsored [employer], including [employer], [place], or [employer].   You generally cannot be eligible for healthcare insurance through your employer.    How to apply: Eligibility screenings are held every Tuesday and Wednesday afternoon from 1:00 pm until 4:00 pm. You do not need an appointment for the interview!  [employer]  [facility] [address], [place], [place]   [place]  [place] Department  [place]  [phone]    Behavioral Health Resources in the Community: Intensive Outpatient Programs Organization         Address  Phone  Notes  [place]. [address] [place], [place] [phone]   [facility] Outpatient [address] [place], [place]   ADS: [facility] [address] [place], [place]   [place] [address]  [place], [place] or [phone]   Substance Abuse Resources Organization         Address  Phone  Notes  Alcohol and Drug Services  [phone]   [place]  [phone]   [facility] [place]   Chinita Pester  [phone]   Residential & Outpatient Substance Abuse Program  [phone]   Psychological Services Organization         Address  Phone  Notes  [facility]  [place]  [phone]   [place] [address] [place] or [phone]    Mobile Crisis Teams Organization         Address  Phone  Notes  [facility], Mobile Crisis Care Unit  [phone]   [facility]  [address]  [place], [place]   [place] Levels [address] [place] [phone]    Self-Help/Support Groups Organization         Address  Phone             Notes  [place]. of [place] - variety of support groups  [place] Call for more information  Narcotics Anonymous (NA), [facility] [address], [employer] [place]  2 meetings at this location   Special educational needs teacher         Address  Phone  Notes  ASAP Residential Treatment [place],    [place]  [phone]   [facility]  [address], [place] [phone], [place], [place]   [place], [place] [phone] Admissions: 8am-3pm M-F  Incentives Substance [place] [address],    [place], [place] [phone]   [facility] [address] [place], [place], [place]   [facility] [address]  [place], [place]   [facility] - Intensive Outpatient [address], [place], [place]   [facility] ([place].,  [place], [place] [phone] or [phone]   [facility] (RTS) [address] [place], [place] Accepts Medicaid  [facility] [place] [address],  [place] [phone] Substance Abuse/Addiction Treatment   [facility] Organization         Address  Phone  Notes  [facility]  [phone]   Domenic Schwab, PhD [address] Arlis Porta [place], [place]   [phone] or [phone]   [place], [place] [phone]   [place] [address], [place], [place] [phone] Insurance/Medicaid/sponsorship through [employer] and Families [address] [place] 017  [place], [place] [phone] [place], [place] [phone]    Dr. Adele Schilder  [phone]   [facility] Dept. 1) [address] [place] 2) [place] 3)  [place] [address], [place] [phone]  [phone]   [place] [phone] or [phone] (After Hours)

## 2014-09-12 ENCOUNTER — Emergency Department (HOSPITAL_COMMUNITY)
Admission: EM | Admit: 2014-09-12 | Discharge: 2014-09-12 | Disposition: A | Discharge status: Home / Self Care | Payer: BC Managed Care – PPO | Attending: Emergency Medicine | Admitting: Emergency Medicine

## 2014-09-12 ENCOUNTER — Emergency Department (HOSPITAL_COMMUNITY): Payer: BC Managed Care – PPO

## 2014-09-12 ENCOUNTER — Encounter (HOSPITAL_COMMUNITY): Payer: Self-pay | Admitting: *Deleted

## 2014-09-12 DIAGNOSIS — M79673 Pain in unspecified foot: Secondary | ICD-10-CM

## 2014-09-12 DIAGNOSIS — Z791 Long term (current) use of non-steroidal anti-inflammatories (NSAID): Secondary | ICD-10-CM | POA: Diagnosis not present

## 2014-09-12 DIAGNOSIS — M7731 Calcaneal spur, right foot: Secondary | ICD-10-CM | POA: Diagnosis not present

## 2014-09-12 DIAGNOSIS — Z720 Tobacco use: Secondary | ICD-10-CM | POA: Insufficient documentation

## 2014-09-12 DIAGNOSIS — M79671 Pain in right foot: Secondary | ICD-10-CM | POA: Diagnosis present

## 2014-09-12 MED ORDER — TRAMADOL HCL 50 MG PO TABS: 50.0000 mg | ORAL_TABLET | Freq: Four times a day (QID) | ORAL | Status: DC | PRN

## 2014-09-12 NOTE — ED Notes (Signed)
Declined W/C at D/C and was escorted to lobby by RN. 

## 2014-09-12 NOTE — ED Notes (Signed)
Pt reports pain to right foot and swelling, hx of bone spur, denies any injury.  Ambulatory at triage.

## 2014-09-12 NOTE — ED Provider Notes (Signed)
CSN: [id number]     Arrival date & time 09/12/14  1427 History  This chart was scribed for non-physician practitioner, Hyman Bible, PA-C working with Richarda Blade, MD by Einar Pheasant, ED scribe. This patient was seen in room TR06C/TR06C and the patient's care was started at 3:13 PM.    Chief Complaint  Patient presents with  . Foot Pain   The history is provided by the patient and medical records. No language interpreter was used.   HPI Comments: Toni Kennedy is a 49 y.o. female with PMhx of back pain and bone spurs (to heel) presents to the Emergency Department complaining of sudden onset gradually worsening right foot pain that started 1.5 weeks. She endorses associated swelling and numbness to the medial side of her right foot. Pt states that her current discomfort is not similar to her past heel spur. Denies any injuries or falls. Pt numbness or tingling.  Denies erythema or warmth of the foot.  She has been taking Mobic for the pain without relief.   Past Medical History  Diagnosis Date  . Back complaints    Past Surgical History  Procedure Laterality Date  . Cesarean section    . Tubal ligation    . Knee surgery     History reviewed. No pertinent family history. History  Substance Use Topics  . Smoking status: Current Some Day Smoker    Types: Cigars  . Smokeless tobacco: Not on file  . Alcohol Use: No   OB History    No data available     Review of Systems  Constitutional: Negative for fever.  Musculoskeletal: Positive for joint swelling and arthralgias. Negative for myalgias.  Neurological: Negative for weakness and numbness.   Allergies  Sulfa antibiotics and Tetracyclines & related  Home Medications   Prior to Admission medications   Medication Sig Start Date End Date Taking? Authorizing Provider  diphenhydramine-acetaminophen (TYLENOL PM) 25-500 MG TABS Take 1-2 tablets by mouth at bedtime as needed (sleep).    Historical Provider, MD  meclizine  (ANTIVERT) 25 MG tablet Take 1 tablet (25 mg total) by mouth 2 (two) times daily as needed for dizziness. 06/19/14   Everlene Balls, MD  meloxicam (MOBIC) 15 MG tablet Take 15 mg by mouth daily.    Historical Provider, MD   Triage Vitals:BP 109/81 mmHg  Pulse 72  Temp(Src) 98.5 F (36.9 C)  Resp 16  Ht 5\' 5"  (1.651 m)  Wt 190 lb (86.183 kg)  BMI 31.62 kg/m2  SpO2 97%  Physical Exam  Constitutional: She is oriented to person, place, and time. She appears well-developed and well-nourished. No distress.  HENT:  Head: Normocephalic and atraumatic.  Eyes: Conjunctivae and EOM are normal.  Neck: Neck supple.  Cardiovascular: Normal rate, regular rhythm and normal heart sounds.   Pulses:      Dorsalis pedis pulses are 2+ on the right side.  Pulmonary/Chest: Effort normal and breath sounds normal. No respiratory distress. She has no wheezes. She has no rales.  Musculoskeletal: Normal range of motion.  Tenderness to palpation of the right foot in the area of the medial heel. No erythema or warmth.   Neurological: She is alert and oriented to person, place, and time.  Distal sensation of the right foot intact  Skin: Skin is warm and dry.  Psychiatric: She has a normal mood and affect. Her behavior is normal.  Nursing note and vitals reviewed.   ED Course  Procedures (including critical care time)  DIAGNOSTIC STUDIES: Oxygen Saturation is 97% on RA, adequate by my interpretation.    COORDINATION OF CARE: 3:19 PM- Pt advised of plan for treatment and pt agrees.  Labs Review Labs Reviewed - No data to display  Imaging Review Dg Foot Complete Right  09/12/2014   CLINICAL DATA:  Right foot pain  EXAM: RIGHT FOOT COMPLETE - 3+ VIEW  COMPARISON:  None.  FINDINGS: There is no evidence of fracture or dislocation. Plantar heel spur noted. There is no evidence of arthropathy or other focal bone abnormality. Soft tissues are unremarkable.  IMPRESSION: Negative.   Electronically Signed   By:  Kerby Moors M.D.   On: 09/12/2014 15:55     EKG Interpretation None      MDM   Final diagnoses:  None   Patient presents with pain of the right foot.  No injury or trauma.  No signs of infection.  Xray showing a plantar spur.  Patient stable for discharge.  Return precautions given.     Hyman Bible, PA-C 09/12/14 1628  Richarda Blade, MD 09/12/14 [phone]

## 2014-12-23 ENCOUNTER — Emergency Department (INDEPENDENT_AMBULATORY_CARE_PROVIDER_SITE_OTHER): Payer: BC Managed Care – PPO

## 2014-12-23 ENCOUNTER — Emergency Department (INDEPENDENT_AMBULATORY_CARE_PROVIDER_SITE_OTHER)
Admission: EM | Admit: 2014-12-23 | Discharge: 2014-12-23 | Disposition: A | Discharge status: Home / Self Care | Payer: BC Managed Care – PPO | Source: Home / Self Care | Attending: Family Medicine | Admitting: Family Medicine

## 2014-12-23 ENCOUNTER — Encounter (HOSPITAL_COMMUNITY): Payer: Self-pay | Admitting: Emergency Medicine

## 2014-12-23 DIAGNOSIS — M76821 Posterior tibial tendinitis, right leg: Secondary | ICD-10-CM | POA: Diagnosis not present

## 2014-12-23 DIAGNOSIS — M2242 Chondromalacia patellae, left knee: Secondary | ICD-10-CM | POA: Diagnosis not present

## 2014-12-23 NOTE — ED Provider Notes (Signed)
Toni Kennedy is a 49 y.o. female who presents to Urgent Care today for left knee pain and right ankle pain.  1) patient notes 2 months of chronic left anterior knee pain worse with climbing stairs. She notes popping and clicking of the knee. No radiating pain weakness or numbness. She suffered a patella fracture years and years ago which required surgery. She's been seen by a sports medicine doctor who is done corticosteroid injections which helped temporarily. 2) right ankle pain. Patient has right medial ankle pain radiating from her mid calf to the navicular prominence. Pain is worse with foot plantar flexion and inversion. She's been seen by her sports medicine doctor who diagnosed her with posterior tibialis tendinitis and she did with a cam walker boot and NSAIDs. This is no longer helping. She has not had a trial of physical therapy.   Past Medical History  Diagnosis Date  . Back complaints    Past Surgical History  Procedure Laterality Date  . Cesarean section    . Tubal ligation    . Knee surgery     History  Substance Use Topics  . Smoking status: Current Some Day Smoker    Types: Cigars  . Smokeless tobacco: Not on file  . Alcohol Use: No   ROS as above Medications: No current facility-administered medications for this encounter.   Current Outpatient Prescriptions  Medication Sig Dispense Refill  . acetaminophen (TYLENOL) 500 MG tablet Take 500 mg by mouth every 6 (six) hours as needed for mild pain.    . diphenhydramine-acetaminophen (TYLENOL PM) 25-500 MG TABS Take 1-2 tablets by mouth at bedtime as needed (sleep).    . meclizine (ANTIVERT) 25 MG tablet Take 1 tablet (25 mg total) by mouth 2 (two) times daily as needed for dizziness. 14 tablet 0  . meloxicam (MOBIC) 15 MG tablet Take 15 mg by mouth daily.    . traMADol (ULTRAM) 50 MG tablet Take 1 tablet (50 mg total) by mouth every 6 (six) hours as needed. 15 tablet 0   Allergies  Allergen Reactions  . Sulfa  Antibiotics Hives  . Tetracyclines & Related Other (See Comments)    sunlight     Exam:  BP 132/82 mmHg  Pulse 72  Temp(Src) 98.2 F (36.8 C) (Oral)  Resp 16  SpO2 96%  LMP 12/09/2014 Gen: Well NAD HEENT: EOMI,  MMM Lungs: Normal work of breathing. CTABL Heart: RRR no MRG Abd: NABS, Soft. Nondistended, Nontender Exts: Brisk capillary refill, warm and well perfused.  Right ankle normal appearing no significant swelling. Tender to palpation at the tarsal tunnel. Motion is intact the patient has significant pain with active foot inversion. Standing she has pronation, mild ankle subluxation and pes planus. The foot itself is nontender with normal pulses and capillary refill  The left knee has a well appearing mature scar on the anterior aspect of the knee. The knee itself is nontender. Range of motion 0-120 with 2+ retropatellar crepitations on extension. Stable ligamentous exam. Negative McMurray's test.   No results found for this or any previous visit (from the past 24 hour(s)). Dg Knee Ap/lat W/sunrise Left  12/23/2014   CLINICAL DATA:  49 year old female with left knee pain for 3 months with no known injury. Remote history of knee surgery in 1997. Subsequent encounter.  EXAM: LEFT KNEE 3 VIEWS  COMPARISON:  03/03/2010.  FINDINGS: Postoperative changes to the patella. Chronic appearing fragmentation at the lateral patella. Mild joint space loss at that site but  otherwise patellofemoral joint space appears relatively preserved. Medial and lateral chronic mild joint space loss with moderate degenerative spurring, not significantly changed. No joint effusion. No acute osseous abnormality identified.  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 No significant progression since 2011.   Electronically Signed   By: Genevie Ann M.D.   On: 12/23/2014 12:23    Assessment and Plan: 49 y.o. female with  1) Left knee pain: Presently the patient has  less DJD of her patellofemoral joint and I was expecting on x-ray. 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 I suspect she has a patellofemoral pain syndrome component as well. Discussed quad, and VMO strengthening exercises. 2) ankle pain: Clinically posterior tibial tendinitis. 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 At this point I recommend an MRI to fully characterize the nature of her pain and dedicated home exercises or physical therapy. Follow up with sports medicine.   Discussed warning signs or symptoms. Please see discharge instructions. Patient expresses understanding.     Gregor Hams, MD 12/23/14 [phone]

## 2014-12-23 NOTE — Discharge Instructions (Signed)
Thank you for coming in today. Due to the knee exercises we discussed. Do 30 repetitions 2 or 3 times daily 1) straight leg raise 2) toe rotated outward straight leg raise 3) knee squeeze knee straightening.   Resume the Cam Walker brace as needed Take up to 2 Aleve for pain as needed  Follow-up with Dr. Tamala Julian regarding your ankle.  Return as needed  Patellofemoral Syndrome If you have had pain in the front of your knee for a long time, chances are good that you have patellofemoral syndrome. The word patella refers to the kneecap. Femoral (or femur) refers to the thigh bone. That is the bone the kneecap sits on. The kneecap is shaped like a triangle. Its job is to protect the knee and to improve the efficiency of your thigh muscles (quadriceps). The underside of the kneecap is made of smooth tissue (cartilage). This lets the kneecap slide up and down as the knee moves. Sometimes this cartilage becomes soft. Your healthcare provider may say the cartilage breaks down. That is patellofemoral syndrome. It can affect one knee, or both. The condition is sometimes called patellofemoral pain syndrome. That is because the condition is painful. The pain usually gets worse with activity. Sitting for a long time with the knee bent also makes the pain worse. It usually gets better with rest and proper treatment. CAUSES  No one is sure why some people develop this problem and others do not. Runners often get it. One name for the condition is "runner's knee." However, some people run for years and never have knee pain. Certain things seem to make patellofemoral syndrome more likely. They include:  Moving out of alignment. The kneecap is supposed to move in a straight line when the thigh muscle pulls on it. Sometimes the kneecap moves in poor alignment. That can make the knee swell and hurt. Some experts believe it also wears down the cartilage.  Injury to the kneecap.  Strain on the knee. This may occur during  sports activity. Soccer, running, skiing and cycling can put excess stress on the knee.  Being flat-footed or knock-kneed. SYMPTOMS   Knee pain.  Pain under the kneecap. This is usually a dull, aching pain.  Pain in the knee when doing certain things: squatting, kneeling, going up or down stairs.  Pain in the knee when you stand up after sitting down for awhile.  Tightness in the knee.  Loss of muscle strength in the thigh.  Swelling of the knee. DIAGNOSIS  Healthcare providers often send people with knee pain to an orthopedic caregiver. This person has special training to treat problems with bones and joints. To decide what is causing your knee pain, your caregiver will probably:  Do a physical exam. This will probably include:  Asking about symptoms you have noticed.  Asking about your activities and any injuries.  Feeling your knee. Moving it. This will help test the knee's strength. It will also check alignment (whether the knee and leg are aligned normally).  Order some tests, such as:  Imaging tests. They create pictures of the inside of the knee. Tests may include:  X-rays.  Computed tomography (CT) scan. This uses X-rays and a computer to show more detail.  Magnetic resonance imaging (MRI). This test uses magnets, radio waves and a computer to make pictures. TREATMENT   Medication is almost always used first. It can relieve pain. It also can reduce swelling. Non-steroidal anti-inflammatory medicines (called NSAIDs) are usually suggested. Sometimes a stronger  form is needed. A stronger form would require a prescription.  Other treatment may be needed after the swelling goes down. Possibilities include:  Exercise. Certain exercises can make the muscles around the knee stronger which decreases the pressure on the knee cap. This includes the thigh muscle. Certain exercises also may be suggested to increase your flexibility.  A knee brace. This gives the knee extra  support and helps align the movement of the knee cap.  Orthotics. These are special shoe inserts. They can help keep your leg and knee aligned.  Surgery is sometimes needed. This is rare. Options include:  Arthroscopy. The surgeon uses a special tool to remove any damaged pieces of the kneecap. Only a few small incisions (cuts) are needed.  Realignment. This is open surgery. The goals are to reduce pressure and fix the way the kneecap moves. HOME CARE INSTRUCTIONS   Take any medication prescribed by your healthcare provider. Follow the directions carefully.  If your knee is swollen:  Put ice or cold packs on it. Do this for 20 to 30 minutes, 3 to 4 times a day.  Keep the knee raised. Make sure it is supported. Put a pillow under it.  Rest your knee. For example, take the elevator instead of the stairs for awhile. Or, take a break from sports activity that strain your knee. Try walking or swimming instead.  Whenever you are active:  Use an elastic bandage on your knee. This gives it support.  After any activity, put ice or cold packs on your knees. Do this for about 10 to 20 minutes.  Make sure you wear shoes that give good support. Make sure they are not worn down. The heels should not slant in or out. SEEK MEDICAL CARE IF:   Knee pain gets worse. Or it does not go away, even after taking pain medicine.  Swelling does not go down.  Your thigh muscle becomes weak.  You have an oral temperature above 102 F (38.9 C). SEEK IMMEDIATE MEDICAL CARE IF:  You have an oral temperature above 102 F (38.9 C), not controlled by medicine. Document Released: 07/26/2009 Document Revised: 10/30/2011 Document Reviewed: 10/27/2013 [facility] Patient Information 2015 [place], [place]. This information is not intended to replace advice given to you by your health care provider. Make sure you discuss any questions you have with your health care provider.   Posterior Tibial Tendon  Tendinitis with Rehab Tendonitis is a condition that is characterized by inflammation of a tendon or the lining (sheath) that surrounds it. The inflammation is usually caused by damage to the tendon, such as a tendon tear (strain). Sprains are classified into three categories. Grade 1 sprains cause pain, but the tendon is not lengthened. Grade 2 sprains include a lengthened ligament due to the ligament being stretched or partially ruptured. With grade 2 sprains there is still function, although the function may be diminished. Grade 3 sprains are characterized by a complete tear of the tendon or muscle, and function is usually impaired. Posterior tibialis tendonitis is tendonitis of the posterior tibial tendon, which attaches muscles of the lower leg to the foot. The posterior tibial tendon is located in the back of the ankle and helps the body straighten (plantar flex) and rotate inward (medially rotate) the ankle. SYMPTOMS   Pain, tenderness, swelling, warmth, and/or redness over the back of the inner ankle at the posterior tibial tendon or the inner part of the mid-foot.  Pain that worsens with plantar flexion or medial  rotation of the ankle.  A crackling sound (crepitation) when the tendon is moved or touched. CAUSES  Posterior tibial tendonitis occurs when damage to the posterior tibial tendon starts an inflammatory response. Common mechanisms of injury include:  Degenerative (occurs with aging) processes that weaken the tendon and make it more susceptible to injury.  Stress placed on the tendon from an increase in the intensity, frequency, or duration of training.  Direct trauma to the ankle.  Returning to activity before a previous ankle injury is allowed to heal. RISK INCREASES WITH:  Activities that involve repetitive and/or stressful plantar flexion (jumping, kicking, or running up/down hills).  Poor strength and flexibility.  Flat feet.  Previous injury to the foot, ankle, or  leg. PREVENTION   Warm up and stretch properly before activity.  Allow for adequate recovery between workouts.  Maintain physical fitness:  Strength, flexibility, and endurance.  Cardiovascular fitness.  Learn and use proper technique. When possible, have a coach correct improper technique.  Complete rehabilitation from a previous foot, ankle, or leg injury.  If you have flat feet, wear arch supports (orthotics). PROGNOSIS  If treated properly, the symptoms of tendonitis usually resolve within 6 weeks. This period may be shorter for injuries caused by direct trauma. RELATED COMPLICATIONS   Prolonged healing time, if improperly treated or reinjured.  Recurrent symptoms that result in a chronic problem.  Partial or complete tendon tear (rupture) requiring surgery. TREATMENT  Treatment initially involves the use of ice and medication to help reduce pain and inflammation. The use of strengthening and stretching exercises may help reduce pain with activity. These exercises may be performed at home or with referral to a therapist. Often times, your caregiver will recommend immobilizing the ankle to allow the tendon to heal. If you have flat feet, you may be advised to wear orthotic arch supports. If symptoms persist for greater than 6 months despite nonsurgical (conservative) treatment, then surgery may be recommended. MEDICATION   If pain medication is necessary, then nonsteroidal anti-inflammatory medications, such as aspirin and ibuprofen, or other minor pain relievers, such as acetaminophen, are often recommended.  Do not take pain medication for 7 days before surgery.  Prescription pain relievers may be given if deemed necessary by your caregiver. Use only as directed and only as much as you need.  Corticosteroid injections may be given by your caregiver. These injections should be reserved for the most serious cases because they may only be given a certain number of times. HEAT  AND COLD  Cold treatment (icing) relieves pain and reduces inflammation. Cold treatment should be applied for 10 to 15 minutes every 2 to 3 hours for inflammation and pain and immediately after any activity that aggravates your symptoms. Use ice packs or massage the area with a piece of ice (ice massage).  Heat treatment may be used prior to performing the stretching and strengthening activities prescribed by your caregiver, physical therapist, or athletic trainer. Use a heat pack or soak the injury in warm water. SEEK MEDICAL CARE IF:  Treatment seems to offer no benefit, or the condition worsens.  Any medications produce adverse side effects. EXERCISES RANGE OF MOTION (ROM) AND STRETCHING EXERCISES - Posterior Tibial Tendon Tendinitis These exercises may help you when beginning to rehabilitate your injury. Your symptoms may resolve with or without further involvement from your physician, physical therapist or athletic trainer. While completing these exercises, remember:   Restoring tissue flexibility helps normal motion to return to the joints. This allows  healthier, less painful movement and activity.  An effective stretch should be held for at least 30 seconds.  A stretch should never be painful. You should only feel a gentle lengthening or release in the stretched tissue. RANGE OF MOTION - Ankle Plantar Flexion   Sit with your right / left leg crossed over your opposite knee.  Use your opposite hand to pull the top of your foot and toes toward you.  You should feel a gentle stretch on the top of your foot/ankle. Hold this position for __________ seconds. Repeat __________ times. Complete this exercise __________ times per day.  RANGE OF MOTION - Ankle Eversion   Sit with your right / left ankle crossed over your opposite knee.  Grip your foot with your opposite hand, placing your thumb on the top of your foot and your fingers across the bottom of your foot.  Gently push your  foot downward with a slight rotation so your littlest toes rise slightly.  You should feel a gentle stretch on the inside of your ankle. Hold the stretch for __________ seconds. Repeat __________ times. Complete this exercise __________ times per day.  RANGE OF MOTION - Ankle Inversion   Sit with your right / left ankle crossed over your opposite knee.  Grip your foot with your opposite hand, placing your thumb on the bottom of your foot and your fingers across the top of your foot.  Gently pull your foot so the smallest toe comes toward you and your thumb pushes the inside of the ball of your foot away from you.  You should feel a gentle stretch on the outside of your ankle. Hold the stretch for __________ seconds. Repeat __________ times. Complete this exercise __________ times per day.  RANGE OF MOTION - Dorsi/Plantar Flexion  While sitting with your right / left knee straight, draw the top of your foot upward by flexing your ankle. Then reverse the motion, pointing your toes downward.  Hold each position for __________ seconds.  After completing your first set of exercises, repeat this exercise with your knee bent. Repeat __________ times. Complete this exercise __________ times per day.  RANGE OF MOTION - Ankle Alphabet  Imagine your right / left big toe is a pen.  Keeping your hip and knee still, write out the entire alphabet with your "pen." Make the letters as large as you can without increasing any discomfort. Repeat __________ times. Complete this exercise __________ times per day.  STRETCH - Gastrocsoleus   Sit with your right / left leg extended. Holding onto both ends of a belt or towel, loop it around the ball of your foot.  Keeping your right / left ankle and foot relaxed and your knee straight, pull your foot and ankle toward you using the belt/towel.  You should feel a gentle stretch behind your calf or knee. Hold this position for __________ seconds. Repeat  __________ times. Complete this exercise __________ times per day.  STRETCH - Gastroc, Standing   Place hands on wall.  Extend right / left leg, keeping the front knee somewhat bent.  Slightly point your toes inward on your back foot.  Keeping your right / left heel on the floor and your knee straight, shift your weight toward the wall, not allowing your back to arch.  You should feel a gentle stretch in the right / left calf. Hold this position for __________ seconds. Repeat __________ times. Complete this stretch __________ times per day. STRETCH - Soleus, Standing  Place hands on wall.  Extend right / left leg, keeping the other knee somewhat bent.  Slightly point your toes inward on your back foot.  Keep your right / left heel on the floor, bend your back knee, and slightly shift your weight over the back leg so that you feel a gentle stretch deep in your back calf.  Hold this position for __________ seconds. Repeat __________ times. Complete this stretch __________ times per day. STRENGTHENING EXERCISES - Posterior Tibial Tendon Tendinitis These exercises may help you when beginning to rehabilitate your injury. They may resolve your symptoms with or without further involvement from your physician, physical therapist, or athletic trainer. While completing these exercises, remember:   Muscles can gain both the endurance and the strength needed for everyday activities through controlled exercises.  Complete these exercises as instructed by your physician, physical therapist, or athletic trainer. Progress the resistance and repetitions only as guided. STRENGTH - Dorsiflexors  Secure a rubber exercise band/tubing to a fixed object (i.e., table, pole) and loop the other end around your right / left foot.  Sit on the floor facing the fixed object. The band/tubing should be slightly tense when your foot is relaxed.  Slowly draw your foot back toward you using your ankle and  toes.  Hold this position for __________ seconds. Slowly release the tension in the band and return your foot to the starting position. Repeat __________ times. Complete this exercise __________ times per day.  STRENGTH - Towel Curls  Sit in a chair positioned on a non-carpeted surface.  Place your foot on a towel, keeping your heel on the floor.  Pull the towel toward your heel by only curling your toes. Keep your heel on the floor.  If instructed by your physician, physical therapist, or athletic trainer, add ____________________ at the end of the towel. Repeat __________ times. Complete this exercise __________ times per day. STRENGTH - Ankle Eversion   Secure one end of a rubber exercise band/tubing to a fixed object (table, pole). Loop the other end around your foot just before your toes.  Place your fists between your knees. This will focus your strengthening at your ankle.  Drawing the band/tubing across your opposite foot, slowly pull your little toe out and up. Make sure the band/tubing is positioned to resist the entire motion.  Hold this position for __________ seconds.  Have your muscles resist the band/tubing as it slowly pulls your foot back to the starting position. Repeat __________ times. Complete this exercise __________ times per day.  STRENGTH - Ankle Inversion   Secure one end of a rubber exercise band/tubing to a fixed object (table, pole). Loop the other end around your foot just before your toes.  Place your fists between your knees. This will focus your strengthening at your ankle.  Slowly, pull your big toe up and in, making sure the band/tubing is positioned to resist the entire motion.  Hold this position for __________ seconds.  Have your muscles resist the band/tubing as it slowly pulls your foot back to the starting position. Repeat __________ times. Complete this exercises __________ times per day.  Document Released: 08/07/2005 Document Revised:  12/22/2013 Document Reviewed: 11/19/2008 [facility] Patient Information 2015 [place], [place]. This information is not intended to replace advice given to you by your health care provider. Make sure you discuss any questions you have with your health care provider.

## 2014-12-23 NOTE — ED Notes (Signed)
C/o left knee pain onset 2 months; getting worse; hx of left patellar surgery Denies inj/trauma Also reports right foot pain onset 6 months; Orthopedic treating w/medicaiton Denies inj/trauma Alert, no signs of acute distress.

## 2015-02-16 ENCOUNTER — Encounter: Payer: Self-pay | Admitting: Family Medicine

## 2015-02-16 ENCOUNTER — Ambulatory Visit (INDEPENDENT_AMBULATORY_CARE_PROVIDER_SITE_OTHER): Payer: BC Managed Care – PPO | Admitting: Family Medicine

## 2015-02-16 ENCOUNTER — Other Ambulatory Visit (INDEPENDENT_AMBULATORY_CARE_PROVIDER_SITE_OTHER): Payer: BC Managed Care – PPO

## 2015-02-16 VITALS — BP 108/66 | HR 77 | Wt 202.0 lb

## 2015-02-16 DIAGNOSIS — M76821 Posterior tibial tendinitis, right leg: Secondary | ICD-10-CM | POA: Diagnosis not present

## 2015-02-16 DIAGNOSIS — M76829 Posterior tibial tendinitis, unspecified leg: Secondary | ICD-10-CM | POA: Insufficient documentation

## 2015-02-16 DIAGNOSIS — M25561 Pain in right knee: Secondary | ICD-10-CM | POA: Diagnosis not present

## 2015-02-16 DIAGNOSIS — M2242 Chondromalacia patellae, left knee: Secondary | ICD-10-CM

## 2015-02-16 MED ORDER — DICLOFENAC SODIUM 2 % TD SOLN: TRANSDERMAL | Status: DC

## 2015-02-16 NOTE — Assessment & Plan Note (Signed)
Patient was injected today. We discussed heel lift, home exercises, icing protocol, patient work with Product/process development scientist today. Patient will try topical anti-inflammatory's. Patient come back in 3-4 weeks for further evaluation and treatment.

## 2015-02-16 NOTE — Patient Instructions (Signed)
Good to see you.  Ice 20 minutes 2 times daily. Usually after activity and before bed. Exercises 3 times a week.  Wear brace with activity Get a heel lift from [facility] to add to your shoes  Try the Pennsaid (topical) to affected area - it will be mailed to you Stay active - walk on soft surfaces, biking, swimming  Try better shoes - stiffer insole - New Balance(over 700), Jennet Maduro, Dansko  Tylenol 500mg  3 times a day Turmeric 500mg  twice daily Fish oil 2 grams daily See me again in 4 weeks.   Posterior Tibial Tendon Tendinitis with Rehab Tendonitis is a condition that is characterized by inflammation of a tendon or the lining (sheath) that surrounds it. The inflammation is usually caused by damage to the tendon, such as a tendon tear (strain). Sprains are classified into three categories. Grade 1 sprains cause pain, but the tendon is not lengthened. Grade 2 sprains include a lengthened ligament due to the ligament being stretched or partially ruptured. With grade 2 sprains there is still function, although the function may be diminished. Grade 3 sprains are characterized by a complete tear of the tendon or muscle, and function is usually impaired. Posterior tibialis tendonitis is tendonitis of the posterior tibial tendon, which attaches muscles of the lower leg to the foot. The posterior tibial tendon is located in the back of the ankle and helps the body straighten (plantar flex) and rotate inward (medially rotate) the ankle. SYMPTOMS   Pain, tenderness, swelling, warmth, and/or redness over the back of the inner ankle at the posterior tibial tendon or the inner part of the mid-foot.  Pain that worsens with plantar flexion or medial rotation of the ankle.  A crackling sound (crepitation) when the tendon is moved or touched. CAUSES  Posterior tibial tendonitis occurs when damage to the posterior tibial tendon starts an inflammatory response. Common mechanisms of injury include:  Degenerative  (occurs with aging) processes that weaken the tendon and make it more susceptible to injury.  Stress placed on the tendon from an increase in the intensity, frequency, or duration of training.  Direct trauma to the ankle.  Returning to activity before a previous ankle injury is allowed to heal. RISK INCREASES WITH:  Activities that involve repetitive and/or stressful plantar flexion (jumping, kicking, or running up/down hills).  Poor strength and flexibility.  Flat feet.  Previous injury to the foot, ankle, or leg. PREVENTION   Warm up and stretch properly before activity.  Allow for adequate recovery between workouts.  Maintain physical fitness:  Strength, flexibility, and endurance.  Cardiovascular fitness.  Learn and use proper technique. When possible, have a coach correct improper technique.  Complete rehabilitation from a previous foot, ankle, or leg injury.  If you have flat feet, wear arch supports (orthotics). PROGNOSIS  If treated properly, the symptoms of tendonitis usually resolve within 6 weeks. This period may be shorter for injuries caused by direct trauma. RELATED COMPLICATIONS   Prolonged healing time, if improperly treated or reinjured.  Recurrent symptoms that result in a chronic problem.  Partial or complete tendon tear (rupture) requiring surgery. TREATMENT  Treatment initially involves the use of ice and medication to help reduce pain and inflammation. The use of strengthening and stretching exercises may help reduce pain with activity. These exercises may be performed at home or with referral to a therapist. Often times, your caregiver will recommend immobilizing the ankle to allow the tendon to heal. If you have flat feet, you may  be advised to wear orthotic arch supports. If symptoms persist for greater than 6 months despite nonsurgical (conservative) treatment, then surgery may be recommended. MEDICATION   If pain medication is necessary, then  nonsteroidal anti-inflammatory medications, such as aspirin and ibuprofen, or other minor pain relievers, such as acetaminophen, are often recommended.  Do not take pain medication for 7 days before surgery.  Prescription pain relievers may be given if deemed necessary by your caregiver. Use only as directed and only as much as you need.  Corticosteroid injections may be given by your caregiver. These injections should be reserved for the most serious cases because they may only be given a certain number of times. HEAT AND COLD  Cold treatment (icing) relieves pain and reduces inflammation. Cold treatment should be applied for 10 to 15 minutes every 2 to 3 hours for inflammation and pain and immediately after any activity that aggravates your symptoms. Use ice packs or massage the area with a piece of ice (ice massage).  Heat treatment may be used prior to performing the stretching and strengthening activities prescribed by your caregiver, physical therapist, or athletic trainer. Use a heat pack or soak the injury in warm water. SEEK MEDICAL CARE IF:  Treatment seems to offer no benefit, or the condition worsens.  Any medications produce adverse side effects. EXERCISES RANGE OF MOTION (ROM) AND STRETCHING EXERCISES - Posterior Tibial Tendon Tendinitis These exercises may help you when beginning to rehabilitate your injury. Your symptoms may resolve with or without further involvement from your physician, physical therapist or athletic trainer. While completing these exercises, remember:   Restoring tissue flexibility helps normal motion to return to the joints. This allows healthier, less painful movement and activity.  An effective stretch should be held for at least 30 seconds.  A stretch should never be painful. You should only feel a gentle lengthening or release in the stretched tissue. RANGE OF MOTION - Ankle Plantar Flexion   Sit with your right / left leg crossed over your  opposite knee.  Use your opposite hand to pull the top of your foot and toes toward you.  You should feel a gentle stretch on the top of your foot/ankle. Hold this position for __________ seconds. Repeat __________ times. Complete this exercise __________ times per day.  RANGE OF MOTION - Ankle Eversion   Sit with your right / left ankle crossed over your opposite knee.  Grip your foot with your opposite hand, placing your thumb on the top of your foot and your fingers across the bottom of your foot.  Gently push your foot downward with a slight rotation so your littlest toes rise slightly.  You should feel a gentle stretch on the inside of your ankle. Hold the stretch for __________ seconds. Repeat __________ times. Complete this exercise __________ times per day.  RANGE OF MOTION - Ankle Inversion   Sit with your right / left ankle crossed over your opposite knee.  Grip your foot with your opposite hand, placing your thumb on the bottom of your foot and your fingers across the top of your foot.  Gently pull your foot so the smallest toe comes toward you and your thumb pushes the inside of the ball of your foot away from you.  You should feel a gentle stretch on the outside of your ankle. Hold the stretch for __________ seconds. Repeat __________ times. Complete this exercise __________ times per day.  RANGE OF MOTION - Dorsi/Plantar Flexion  While sitting with  your right / left knee straight, draw the top of your foot upward by flexing your ankle. Then reverse the motion, pointing your toes downward.  Hold each position for __________ seconds.  After completing your first set of exercises, repeat this exercise with your knee bent. Repeat __________ times. Complete this exercise __________ times per day.  RANGE OF MOTION - Ankle Alphabet  Imagine your right / left big toe is a pen.  Keeping your hip and knee still, write out the entire alphabet with your "pen." Make the letters  as large as you can without increasing any discomfort. Repeat __________ times. Complete this exercise __________ times per day.  STRETCH - Gastrocsoleus   Sit with your right / left leg extended. Holding onto both ends of a belt or towel, loop it around the ball of your foot.  Keeping your right / left ankle and foot relaxed and your knee straight, pull your foot and ankle toward you using the belt/towel.  You should feel a gentle stretch behind your calf or knee. Hold this position for __________ seconds. Repeat __________ times. Complete this exercise __________ times per day.  STRETCH - Gastroc, Standing   Place hands on wall.  Extend right / left leg, keeping the front knee somewhat bent.  Slightly point your toes inward on your back foot.  Keeping your right / left heel on the floor and your knee straight, shift your weight toward the wall, not allowing your back to arch.  You should feel a gentle stretch in the right / left calf. Hold this position for __________ seconds. Repeat __________ times. Complete this stretch __________ times per day. STRETCH - Soleus, Standing   Place hands on wall.  Extend right / left leg, keeping the other knee somewhat bent.  Slightly point your toes inward on your back foot.  Keep your right / left heel on the floor, bend your back knee, and slightly shift your weight over the back leg so that you feel a gentle stretch deep in your back calf.  Hold this position for __________ seconds. Repeat __________ times. Complete this stretch __________ times per day. STRENGTHENING EXERCISES - Posterior Tibial Tendon Tendinitis These exercises may help you when beginning to rehabilitate your injury. They may resolve your symptoms with or without further involvement from your physician, physical therapist, or athletic trainer. While completing these exercises, remember:   Muscles can gain both the endurance and the strength needed for everyday  activities through controlled exercises.  Complete these exercises as instructed by your physician, physical therapist, or athletic trainer. Progress the resistance and repetitions only as guided. STRENGTH - Dorsiflexors  Secure a rubber exercise band/tubing to a fixed object (i.e., table, pole) and loop the other end around your right / left foot.  Sit on the floor facing the fixed object. The band/tubing should be slightly tense when your foot is relaxed.  Slowly draw your foot back toward you using your ankle and toes.  Hold this position for __________ seconds. Slowly release the tension in the band and return your foot to the starting position. Repeat __________ times. Complete this exercise __________ times per day.  STRENGTH - Towel Curls  Sit in a chair positioned on a non-carpeted surface.  Place your foot on a towel, keeping your heel on the floor.  Pull the towel toward your heel by only curling your toes. Keep your heel on the floor.  If instructed by your physician, physical therapist, or athletic trainer, add  ____________________ at the end of the towel. Repeat __________ times. Complete this exercise __________ times per day. STRENGTH - Ankle Eversion   Secure one end of a rubber exercise band/tubing to a fixed object (table, pole). Loop the other end around your foot just before your toes.  Place your fists between your knees. This will focus your strengthening at your ankle.  Drawing the band/tubing across your opposite foot, slowly pull your little toe out and up. Make sure the band/tubing is positioned to resist the entire motion.  Hold this position for __________ seconds.  Have your muscles resist the band/tubing as it slowly pulls your foot back to the starting position. Repeat __________ times. Complete this exercise __________ times per day.  STRENGTH - Ankle Inversion   Secure one end of a rubber exercise band/tubing to a fixed object (table, pole). Loop  the other end around your foot just before your toes.  Place your fists between your knees. This will focus your strengthening at your ankle.  Slowly, pull your big toe up and in, making sure the band/tubing is positioned to resist the entire motion.  Hold this position for __________ seconds.  Have your muscles resist the band/tubing as it slowly pulls your foot back to the starting position. Repeat __________ times. Complete this exercises __________ times per day.  Document Released: 08/07/2005 Document Revised: 12/22/2013 Document Reviewed: 11/19/2008 [facility] Patient Information 2015 [place], [place]. This information is not intended to replace advice given to you by your health care provider. Make sure you discuss any questions you have with your health care provider.

## 2015-02-16 NOTE — Progress Notes (Signed)
Pre visit review using our clinic review tool, if applicable. No additional management support is needed unless otherwise documented below in the visit note. 

## 2015-02-16 NOTE — Progress Notes (Signed)
[name] [address], [place] [address] Phone: [phone] Subjective:    I'm seeing this patient by the request  of:  Dr. Georgina Snell MD.   CC: Left knee pain  KGY:JEHUDJSHFW Toni Kennedy is a 49 y.o. female coming in with complaint of left knee pain. Patient has had a chronic problem with this previously and has had surgery. Patient was recently seen in urgent care back in May 2016. Patient's x-rays were done at that time. These were reviewed by me. 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 Patient states the knee still gives her trouble. Patient states usually going up or downstairs seems to be worse. Patient describes the pain as more of a dull throbbing sensation with sharp pain. Denies any giving out on her. Patient has seen multiple providers before last injection was greater than year ago. Patient rates the severity of pain a 5 out of 10. Starting to affect some of her daily living but is not stopping her from daily living. Can have a soreness at night.  She is also complaining of right ankle pain. Patient states that it is more on the posterior medial aspect of the ankle. States that it is worse with activity and very tight in the morning. Patient denies any popping sensation and has never given out on her. Has not tried changing shoes on a regular basis. Has not tried any other home modalities. Patient was given a oral anti-inflammatory and states that the did help. Patient states that this pain is worse than her knee and rates the severity of 8 out of 10. No numbness noted. Does not remember any true injury.     Past Medical History  Diagnosis Date  . Back complaints    Past Surgical History  Procedure Laterality Date  . Cesarean section    . Tubal ligation    . Knee surgery     History  Substance Use Topics  . Smoking status:  Current Some Day Smoker    Types: Cigars  . Smokeless tobacco: Not on file  . Alcohol Use: No   Allergies  Allergen Reactions  . Sulfa Antibiotics Hives  . Tetracyclines & Related Other (See Comments)    sunlight   No family history on file.   Past medical history, social, surgical and family history all reviewed in electronic medical record.   Review of Systems: No headache, visual changes, nausea, vomiting, diarrhea, constipation, dizziness, abdominal pain, skin rash, fevers, chills, night sweats, weight loss, swollen lymph nodes, body aches, joint swelling, muscle aches, chest pain, shortness of breath, mood changes.   Objective Blood pressure 108/66, pulse 77, weight 202 lb (91.627 kg), SpO2 98 %.  General: No apparent distress alert and oriented x3 mood and affect normal, dressed appropriately.  HEENT: Pupils equal, extraocular movements intact  Respiratory: Patient's speak in full sentences and does not appear short of breath  Cardiovascular: No lower extremity edema, non tender, no erythema  Skin: Warm dry intact with no signs of infection or rash on extremities or on axial skeleton.  Abdomen: Soft nontender  Neuro: Cranial nerves II through XII are intact, neurovascularly intact in all extremities with 2+ DTRs and 2+ pulses.  Lymph: No lymphadenopathy of posterior or anterior cervical chain or axillae bilaterally.  Gait normal with good balance and coordination.  MSK:  Non tender with full range of motion and good stability  and symmetric strength and tone of shoulders, elbows, wrist, hips bilaterally.  Knee: Left Mild lateral translation of the patella Mild tenderness over the medial and lateral joint lines as well as the superior lateral patella joint ROM full in flexion and extension and lower leg rotation. Ligaments with solid consistent endpoints including ACL, PCL, LCL, MCL. Negative Mcmurray's, Apley's, and Thessalonian tests. Non painful patellar  compression. Patellar glide without crepitus. Patellar and quadriceps tendons unremarkable. Hamstring and quadriceps strength is normal.    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 Pes planus bilaterally. Her to palpation over the posterior tibialis tendon on the right side. No signs of subluxation. Neurovascular intact distally.  Procedure: Real-time Ultrasound Guided Injection of right posterior tibialis tendon sheath injection Device: GE Logiq E  Ultrasound guided injection is preferred based studies that show increased duration, increased effect, greater accuracy, decreased procedural pain, increased response rate, and decreased cost with ultrasound guided versus blind injection.  Verbal informed consent obtained.  Time-out conducted.  Noted no overlying erythema, induration, or other signs of local infection.  Skin prepped in a sterile fashion.  Local anesthesia: Topical Ethyl chloride.  With sterile technique and under real time ultrasound guidance: With a 25-gauge 1 inch needle was injected with 0.5 mL of 0.5% Marcaine and 0.5 into the tendon sheath Pain immediately resolved suggesting accurate placement of the medication.  Advised to call if fevers/chills, erythema, induration, drainage, or persistent bleeding.  Images permanently stored and available for review in the ultrasound unit.  Impression: Technically successful ultrasound guided injection.  Procedure note 87681; 15 minutes spent for Therapeutic exercises as stated in above notes.  This included exercises focusing on stretching, strengthening, with significant focus on eccentric aspects.  Ankle strengthening that included:  Basic range of motion exercises to allow proper full motion at ankle Stretching of the lower leg and hamstrings  Theraband exercises for the lower leg - inversion, eversion, dorsiflexion and plantarflexion each to be  completed with a theraband Balance exercises to increase proprioception Weight bearing exercises to increase strength and balance Proper technique shown and discussed handout in great detail with ATC.  All questions were discussed and answered.      Impression and Recommendations:     This case required medical decision making of moderate complexity.

## 2015-02-16 NOTE — Assessment & Plan Note (Signed)
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 Patient has had different injections greater than year ago. Patient states that it continues to give her some trouble especially going up or downstairs. We discussed continuing the meloxicam which she has as well as patient given a topical anti-inflammatory. We discussed icing regimen as well as home exercises. Patient was given a patellofemoral brace which I think would be beneficial. Patient continues to have pain when she follows up in 3-4 weeks I would consider injection. Patient could be a candidate for viscous supplementation as well as in the long run.

## 2015-03-16 ENCOUNTER — Ambulatory Visit: Payer: BC Managed Care – PPO | Admitting: Family Medicine

## 2015-04-01 ENCOUNTER — Emergency Department (INDEPENDENT_AMBULATORY_CARE_PROVIDER_SITE_OTHER)
Admission: EM | Admit: 2015-04-01 | Discharge: 2015-04-01 | Disposition: A | Discharge status: Home / Self Care | Payer: BC Managed Care – PPO | Source: Home / Self Care | Attending: Family Medicine | Admitting: Family Medicine

## 2015-04-01 ENCOUNTER — Encounter (HOSPITAL_COMMUNITY): Payer: Self-pay | Admitting: *Deleted

## 2015-04-01 DIAGNOSIS — I8311 Varicose veins of right lower extremity with inflammation: Secondary | ICD-10-CM

## 2015-04-01 NOTE — Discharge Instructions (Signed)
Heat , advil and ted stocking and call vein center for recheck.

## 2015-04-01 NOTE — ED Notes (Signed)
Pt  Has     Pain  /  Swelling  Of   Her   r  Lower  Leg       X  1  Week       Pt  Has  Swelling  Present  denys  Any  specefic  Injury         denys  Any       Chest  Pain  Or  Any  Shortness  Of  Breath

## 2015-04-01 NOTE — ED Provider Notes (Signed)
CSN: [id number]     Arrival date & time 04/01/15  1325 History   First MD Initiated Contact with Patient 04/01/15 1342     Chief Complaint  Patient presents with  . Leg Pain   (Consider location/radiation/quality/duration/timing/severity/associated sxs/prior Treatment) Patient is a 49 y.o. female presenting with leg pain.  Leg Pain Location:  Leg Time since incident:  4 days Injury: no   Leg location:  R lower leg Pain details:    Quality:  Burning and sharp   Radiates to:  Does not radiate   Severity:  Moderate   Onset quality:  Gradual   Progression:  Unchanged Chronicity:  New Dislocation: no   Prior injury to area:  No Worsened by:  Nothing tried Ineffective treatments:  None tried Associated symptoms: no decreased ROM, no fever, no muscle weakness, no numbness and no swelling     Past Medical History  Diagnosis Date  . Back complaints    Past Surgical History  Procedure Laterality Date  . Cesarean section    . Tubal ligation    . Knee surgery     History reviewed. No pertinent family history. Social History  Substance Use Topics  . Smoking status: Current Some Day Smoker    Types: Cigars  . Smokeless tobacco: None  . Alcohol Use: No   OB History    No data available     Review of Systems  Constitutional: Negative.  Negative for fever.  Musculoskeletal: Negative for myalgias, joint swelling and gait problem.  Skin: Negative for wound.    Allergies  Sulfa antibiotics and Tetracyclines & related  Home Medications   Prior to Admission medications   Medication Sig Start Date End Date Taking? Authorizing Provider  acetaminophen (TYLENOL) 500 MG tablet Take 500 mg by mouth every 6 (six) hours as needed for mild pain.    Historical Provider, MD  Diclofenac Sodium 2 % SOLN Apply 1 pump onto the skin twice daily. 02/16/15   Lyndal Pulley, DO  diphenhydramine-acetaminophen (TYLENOL PM) 25-500 MG TABS Take 1-2 tablets by mouth at bedtime as needed (sleep).     Historical Provider, MD  meclizine (ANTIVERT) 25 MG tablet Take 1 tablet (25 mg total) by mouth 2 (two) times daily as needed for dizziness. 06/19/14   Everlene Balls, MD  meloxicam (MOBIC) 15 MG tablet Take 15 mg by mouth daily.    Historical Provider, MD  traMADol (ULTRAM) 50 MG tablet Take 1 tablet (50 mg total) by mouth every 6 (six) hours as needed. 09/12/14   Heather Laisure, PA-C   BP 115/74 mmHg  Pulse 69  Temp(Src) 98.3 F (36.8 C) (Oral)  Resp 16  SpO2 99%  LMP 04/01/2015 Physical Exam  Constitutional: She is oriented to person, place, and time. She appears well-developed and well-nourished. No distress.  Musculoskeletal: She exhibits tenderness. She exhibits no edema.       Legs: Neurological: She is alert and oriented to person, place, and time.  Skin: Skin is warm and dry.  Nursing note and vitals reviewed.   ED Course  Procedures (including critical care time) Labs Review Labs Reviewed - No data to display  Imaging Review No results found.   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

## 2015-05-28 ENCOUNTER — Ambulatory Visit (INDEPENDENT_AMBULATORY_CARE_PROVIDER_SITE_OTHER): Payer: BC Managed Care – PPO | Admitting: Family Medicine

## 2015-05-28 ENCOUNTER — Encounter: Payer: Self-pay | Admitting: Family Medicine

## 2015-05-28 ENCOUNTER — Encounter: Payer: Self-pay | Admitting: *Deleted

## 2015-05-28 ENCOUNTER — Other Ambulatory Visit (INDEPENDENT_AMBULATORY_CARE_PROVIDER_SITE_OTHER): Payer: BC Managed Care – PPO

## 2015-05-28 VITALS — BP 130/82 | HR 64 | Wt 200.0 lb

## 2015-05-28 DIAGNOSIS — M76821 Posterior tibial tendinitis, right leg: Secondary | ICD-10-CM

## 2015-05-28 DIAGNOSIS — M79671 Pain in right foot: Secondary | ICD-10-CM

## 2015-05-28 NOTE — Patient Instructions (Addendum)
Good to see you Toni Kennedy is your friend pennsaid pinkie amount topically 2 times daily as needed.  Spenco orthotics "total support" Exercises 3 times a week.  Attempt to lose a little weight would make a difference 2 cups of water in AM right when you wake up Eat something every 2 hours.  Make sure plenty of protein (1 gram per pound of body weight daily)  150 grams daily Iron 65mg  elemental iron daily See me again in 3-4 weeks.

## 2015-05-28 NOTE — Progress Notes (Signed)
Pre visit review using our clinic review tool, if applicable. No additional management support is needed unless otherwise documented below in the visit note. 

## 2015-05-28 NOTE — Progress Notes (Signed)
[name] [address] [place], [place] [address] Phone: [phone] Subjective:    CC: Right ankle pain follow-up  [id number] Toni Kennedy is a 49 y.o. female coming in with complaint She is also complaining of right ankle pain. Patient's was found to have a posterior tibialis tendinitis and was given an injection. Patient states she was pain free for 2 months and the pain started to come back again. Patient is now having pain even with his regular daily activities. Patient states that she has even stop work because she was unable to stand for long amount of time. Denies any numbness. Patient did not change shoes and has stopped doing the exercises that seemed to be helpful. Not taking any medicine for this problem.     Past Medical History  Diagnosis Date  . Back complaints    Past Surgical History  Procedure Laterality Date  . Cesarean section    . Tubal ligation    . Knee surgery     Social History  Substance Use Topics  . Smoking status: Current Some Day Smoker    Types: Cigars  . Smokeless tobacco: None  . Alcohol Use: No   Allergies  Allergen Reactions  . Sulfa Antibiotics Hives  . Tetracyclines & Related Other (See Comments)    sunlight   No family history on file.   Past medical history, social, surgical and family history all reviewed in electronic medical record.   Review of Systems: No headache, visual changes, nausea, vomiting, diarrhea, constipation, dizziness, abdominal pain, skin rash, fevers, chills, night sweats, weight loss, swollen lymph nodes, body aches, joint swelling, muscle aches, chest pain, shortness of breath, mood changes.   Objective Blood pressure 130/82, pulse 64, weight 200 lb (90.719 kg), SpO2 98 %.  General: No apparent distress alert and oriented x3 mood and affect normal, dressed appropriately.  HEENT: Pupils equal, extraocular movements intact  Respiratory: Patient's speak in full sentences  and does not appear short of breath  Cardiovascular: No lower extremity edema, non tender, no erythema  Skin: Warm dry intact with no signs of infection or rash on extremities or on axial skeleton.  Abdomen: Soft nontender  Neuro: Cranial nerves II through XII are intact, neurovascularly intact in all extremities with 2+ DTRs and 2+ pulses.  Lymph: No lymphadenopathy of posterior or anterior cervical chain or axillae bilaterally.  Gait normal with good balance and coordination.  MSK:  Non tender with full range of motion and good stability and symmetric strength and tone of shoulders, elbows, wrist, hips bilaterally.    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 Pes planus bilaterally. Her to palpation over the posterior tibialis tendon on the right side. No signs of subluxation. Neurovascular intact distally. Minimal change from previous exam.  Procedure: Real-time Ultrasound Guided Injection of right posterior tibialis tendon sheath injection Device: GE Logiq E  Ultrasound guided injection is preferred based studies that show increased duration, increased effect, greater accuracy, decreased procedural pain, increased response rate, and decreased cost with ultrasound guided versus blind injection.  Verbal informed consent obtained.  Time-out conducted.  Noted no overlying erythema, induration, or other signs of local infection.  Skin prepped in a sterile fashion.  Local anesthesia: Topical Ethyl chloride.  With sterile technique and under real time ultrasound guidance: With a 25-gauge 1 inch needle was injected with 0.5 mL of 0.5% Marcaine and 0.5 into  the tendon sheath Pain immediately resolved suggesting accurate placement of the medication.  Advised to call if fevers/chills, erythema, induration, drainage, or persistent bleeding.  Images permanently stored and available for review in the ultrasound unit.    Impression: Technically successful ultrasound guided injection.      Impression and Recommendations:     This case required medical decision making of moderate complexity.

## 2015-05-28 NOTE — Assessment & Plan Note (Signed)
Patient was given an injection today and tolerated the procedure very well. We discussed icing regimen and home exercises. Patient has different medications for pain relief. Given a topical anti-inflammatory as well. We discussed icing regimen. Patient is going to try over-the-counter orthotics we discussed the possibility of needing custom orthotics. Patient otherwise will come back and see me again in 3-4 weeks. Given a note for seated work only.

## 2015-09-22 ENCOUNTER — Encounter (HOSPITAL_COMMUNITY): Payer: Self-pay | Admitting: Emergency Medicine

## 2015-09-22 ENCOUNTER — Emergency Department (INDEPENDENT_AMBULATORY_CARE_PROVIDER_SITE_OTHER)
Admission: EM | Admit: 2015-09-22 | Discharge: 2015-09-22 | Disposition: A | Discharge status: Home / Self Care | Payer: BC Managed Care – PPO | Source: Home / Self Care | Attending: Family Medicine | Admitting: Family Medicine

## 2015-09-22 DIAGNOSIS — K649 Unspecified hemorrhoids: Secondary | ICD-10-CM

## 2015-09-22 MED ORDER — HYDROCORTISONE 2.5 % RE CREA: TOPICAL_CREAM | RECTAL | Status: DC

## 2015-09-22 NOTE — ED Provider Notes (Signed)
CSN: [id number]     Arrival date & time 09/22/15  1316 History   First MD Initiated Contact with Patient 09/22/15 1411     Chief Complaint  Patient presents with  . Blood In Stools   (Consider location/radiation/quality/duration/timing/severity/associated sxs/prior Treatment) HPI History obtained from patient:   LOCATION:rectum SEVERITY: 2 DURATION:today CONTEXT:bowel movement with blood in toilet. QUALITY: MODIFYING FACTORS: came to [facility] no home treatment ASSOCIATED SYMPTOMS: none TIMING:episodic OCCUPATION:disabled  Past Medical History  Diagnosis Date  . Back complaints    Past Surgical History  Procedure Laterality Date  . Cesarean section    . Tubal ligation    . Knee surgery     History reviewed. No pertinent family history. Social History  Substance Use Topics  . Smoking status: Current Some Day Smoker    Types: Cigars  . Smokeless tobacco: None  . Alcohol Use: No   OB History    No data available     Review of Systems ROS +'ve rectal bleeding with minimal pain  Denies: HEADACHE, NAUSEA, ABDOMINAL PAIN, CHEST PAIN, CONGESTION, DYSURIA, SHORTNESS OF BREATH  Allergies  Sulfa antibiotics and Tetracyclines & related  Home Medications   Prior to Admission medications   Medication Sig Start Date End Date Taking? Authorizing Provider  acetaminophen (TYLENOL) 500 MG tablet Take 500 mg by mouth every 6 (six) hours as needed for mild pain.    Historical Provider, MD  Diclofenac Sodium 2 % SOLN Apply 1 pump onto the skin twice daily. 02/16/15   Lyndal Pulley, DO  diphenhydramine-acetaminophen (TYLENOL PM) 25-500 MG TABS Take 1-2 tablets by mouth at bedtime as needed (sleep).    Historical Provider, MD  hydrocortisone (ANUSOL-HC) 2.5 % rectal cream Apply rectally 2 times daily 09/22/15   Konrad Felix, PA  meclizine (ANTIVERT) 25 MG tablet Take 1 tablet (25 mg total) by mouth 2 (two) times daily as needed for dizziness. 06/19/14   Everlene Balls, MD  meloxicam  (MOBIC) 15 MG tablet Take 15 mg by mouth daily.    Historical Provider, MD  traMADol (ULTRAM) 50 MG tablet Take 1 tablet (50 mg total) by mouth every 6 (six) hours as needed. 09/12/14   Hyman Bible, PA-C   Meds Ordered and Administered this Visit  Medications - No data to display  BP 127/80 mmHg  Pulse 64  Temp(Src) 98.5 F (36.9 C) (Oral)  Resp 16  SpO2 100% No data found.   Physical Exam  Constitutional: She appears well-developed and well-nourished.  HENT:  Head: Normocephalic and atraumatic.  Eyes: Conjunctivae are normal.  Neck: Normal range of motion. Neck supple.  Pulmonary/Chest: Effort normal.  Abdominal: Soft. She exhibits no distension. There is no tenderness. There is no rebound and no guarding.  Genitourinary: Guaiac negative stool.  Rectal exam is done with patient's permission and female chaperone present. Large internal hemorrhoid at approximately the 12:00 position of the anus was palpated. There was no active bleeding  Musculoskeletal: Normal range of motion.  Neurological: She is alert.  Skin: Skin is warm and dry.  Nursing note and vitals reviewed.   ED Course  Procedures (including critical care time)  Labs Review Labs Reviewed - No data to display  Imaging Review No results found.   Visual Acuity Review  Right Eye Distance:   Left Eye Distance:   Bilateral Distance:    Right Eye Near:   Left Eye Near:    Bilateral Near:         MDM  1. Bleeding hemorrhoid     Discussion with patient treatment plan including Anusol. She is also advised that if symptoms worsen she may need to see a surgeon and have hemorrhoidectomy. Instructions of care provided discharged home in stable condition.  Konrad Felix, [place] 09/22/15 1536

## 2015-09-22 NOTE — ED Notes (Signed)
C/o blood in stool which was noticed today during bowel movement States she has left flank pain which started last night States she is constipated  Does have a hx of hemorroids

## 2015-09-22 NOTE — Discharge Instructions (Signed)
Nonsurgical Procedures for Hemorrhoids Nonsurgical procedures can be used to treat hemorrhoids. Hemorrhoids are swollen veins that are inside the rectum (internal hemorrhoids) or around the anus (external hemorrhoids). They are caused by increased pressure in the anal area. This pressure may result from straining to have a bowel movement (constipation), diarrhea, pregnancy, obesity, anal sex, or sitting for long periods of time. Hemorrhoids can cause symptoms such as pain and bleeding. Various procedures may be performed if diet changes, lifestyle changes, and other treatments do not help your symptoms. Some of these procedures do not involve surgery. Three common nonsurgical procedures are:  Rubber band ligation. Rubber bands are used to cut off the blood supply to the hemorrhoids.  Sclerotherapy. Medicine is injected into the hemorrhoids to shrink them.  Infrared coagulation. A type of light energy is used to get rid of the hemorrhoids. LET [facility] CARE PROVIDER KNOW ABOUT:  Any allergies you have.  All medicines you are taking, including vitamins, herbs, eye drops, creams, and over-the-counter medicines.  Previous problems you or members of your family have had with the use of anesthetics.  Any blood disorders you have.  Previous surgeries you have had.  Any medical conditions you have.  Whether you are pregnant or may be pregnant. RISKS AND COMPLICATIONS Generally, this is a safe procedure. However, problems may occur, including:  Infection.  Bleeding.  Pain. BEFORE THE PROCEDURE  Ask your health care provider about:  Changing or stopping your regular medicines. This is especially important if you are taking diabetes medicines or blood thinners.  Taking medicines such as aspirin and ibuprofen. These medicines can thin your blood. Do not take these medicines before your procedure if your health care provider instructs you not to.  You may need to have a procedure to  examine the inside of your colon with a scope (colonoscopy). Your health care provider may do this to make sure that there are no other causes for your bleeding or pain. PROCEDURE  Your health care provider will clean your rectal area with a rinsing solution.  A lubricating jelly may be placed into your rectum. The jelly may contain a medicine to numb the area (local anesthetic).  Your health care provider will insert a short scope (anoscope) into your rectum to examine the hemorrhoids.  One of the following techniques will be used. Rubber Band Ligation Your health care provider will place medical instruments through the scope to put rubber bands around the base of your hemorrhoids. The bands will cut off the blood supply to the hemorrhoids. The hemorrhoids will fall off after several days. Sclerotherapy Your health care provider will inject medicine through the scope into your hemorrhoids. This will cause them to shrink and dry up. Infrared Coagulation Your health care provider will shine a type of light through the scope onto your hemorrhoids. This light will generate energy (infrared radiation). It will cause the hemorrhoids to scar and then fall off. Each of these procedures may vary among health care providers and hospitals. AFTER THE PROCEDURE  You will be monitored to make sure that you have no bleeding.  Return to your normal activities as told by your health care provider.   This information is not intended to replace advice given to you by your health care provider. Make sure you discuss any questions you have with your health care provider.   Document Released: 06/04/2009 Document Revised: 04/28/2015 Document Reviewed: 11/02/2014 Elsevier Interactive Patient Education 2016 [employer].  Hemorrhoids Hemorrhoids are puffy (  swollen) veins around the rectum or anus. Hemorrhoids can cause pain, itching, bleeding, or irritation. HOME CARE  Eat foods with fiber, such as whole  grains, beans, nuts, fruits, and vegetables. Ask your doctor about taking products with added fiber in them (fibersupplements).  Drink enough fluid to keep your pee (urine) clear or pale yellow.  Exercise often.  Go to the bathroom when you have the urge to poop. Do not wait.  Avoid straining to poop (bowel movement).  Keep the butt area dry and clean. Use wet toilet paper or moist paper towels.  Medicated creams and medicine inserted into the anus (anal suppository) may be used or applied as told.  Only take medicine as told by your doctor.  Take a warm water bath (sitz bath) for 15-20 minutes to ease pain. Do this 3-4 times a day.  Place ice packs on the area if it is tender or puffy. Use the ice packs between the warm water baths.  Put ice in a plastic bag.  Place a towel between your skin and the bag.  Leave the ice on for 15-20 minutes, 03-04 times a day.  Do not use a donut-shaped pillow or sit on the toilet for a long time. GET HELP RIGHT AWAY IF:   You have more pain that is not controlled by treatment or medicine.  You have bleeding that will not stop.  You have trouble or are unable to poop (bowel movement).  You have pain or puffiness outside the area of the hemorrhoids. MAKE SURE YOU:   Understand these instructions.  Will watch your condition.  Will get help right away if you are not doing well or get worse.   This information is not intended to replace advice given to you by your health care provider. Make sure you discuss any questions you have with your health care provider.   Document Released: 05/16/2008 Document Revised: 07/24/2012 Document Reviewed: 06/18/2012 Elsevier Interactive Patient Education 2016 [place] A disposable sitz bath is a plastic basin that fits over the toilet. A bag is hung above the toilet and is connected to a tube that opens into the disposable sitz bath. The bag is filled with warm water that  can flow into the basin through the tube.  HOW TO USE A DISPOSABLE SITZ BATH  Close the clamp on the tubing before filling the bag with water. This is to prevent leakage.  Fill the sitz bath basin and the plastic bag with warm water.  Place the filled basin on the toilet with the seat raised. Make sure the overflow opening is facing toward the back of the toilet.  Hang the filled plastic bag overhead on a hook or towel rack close to the toilet. When the bag is unclamped, a steady stream of water will flow from the bag, through the tubing, and into the basin.  Attach the tubing to the opening on the basin.  Sit on the basin positioned on the toilet seat and release the clamp. This will allow warm water to flush the area around your genitals and anus (perineum).  Remain sitting on the basin for approximately 15 to 20 minutes.  Stand up and pat the perineum area dry. If needed, apply clean bandages (dressings) to the affected area.  Tip the basin into the toilet to remove any remaining water and flush the toilet.  Wash the basin with warm water and soap. Let it dry in the sink.  Store the  basin and tubing in a clean, dry area.  Wash your hands with soap and water. SEEK MEDICAL CARE IF: You get worse instead of better. Stop the sitz baths if you get worse. MAKE SURE YOU:  Understand these instructions.  Will watch your condition.  Will get help right away if you are not doing well or get worse.   This information is not intended to replace advice given to you by your health care provider. Make sure you discuss any questions you have with your health care provider.   Document Released: 02/06/2012 Document Revised: 05/01/2012 Document Reviewed: 02/06/2012 Elsevier Interactive Patient Education 2016 Elsevier Inc.  High-Fiber Diet Fiber, also called dietary fiber, is a type of carbohydrate found in fruits, vegetables, whole grains, and beans. A high-fiber diet can have many health  benefits. Your health care provider may recommend a high-fiber diet to help:  Prevent constipation. Fiber can make your bowel movements more regular.  Lower your cholesterol.  Relieve hemorrhoids, uncomplicated diverticulosis, or irritable bowel syndrome.  Prevent overeating as part of a weight-loss plan.  Prevent heart disease, type 2 diabetes, and certain cancers. WHAT IS MY PLAN? The recommended daily intake of fiber includes:  38 grams for men under age 73.  63 grams for men over age 59.  36 grams for women under age 75.  74 grams for women over age 28. You can get the recommended daily intake of dietary fiber by eating a variety of fruits, vegetables, grains, and beans. Your health care provider may also recommend a fiber supplement if it is not possible to get enough fiber through your diet. WHAT DO I NEED TO KNOW ABOUT A HIGH-FIBER DIET?  Fiber supplements have not been widely studied for their effectiveness, so it is better to get fiber through food sources.  Always check the fiber content on thenutrition facts label of any prepackaged food. Look for foods that contain at least 5 grams of fiber per serving.  Ask your dietitian if you have questions about specific foods that are related to your condition, especially if those foods are not listed in the following section.  Increase your daily fiber consumption gradually. Increasing your intake of dietary fiber too quickly may cause bloating, cramping, or gas.  Drink plenty of water. Water helps you to digest fiber. WHAT FOODS CAN I EAT? Grains Whole-grain breads. Multigrain cereal. Oats and oatmeal. Brown rice. Barley. Bulgur wheat. Datto. Bran muffins. Popcorn. Rye wafer crackers. Vegetables Sweet potatoes. Spinach. Kale. Artichokes. Cabbage. Broccoli. Green peas. Carrots. Squash. Fruits Berries. Pears. Apples. Oranges. Avocados. Prunes and raisins. Dried figs. Meats and Other Protein Sources Navy, kidney, pinto, and  soy beans. Split peas. Lentils. Nuts and seeds. Dairy Fiber-fortified yogurt. Beverages Fiber-fortified soy milk. Fiber-fortified orange juice. Other Fiber bars. The items listed above may not be a complete list of recommended foods or beverages. Contact your dietitian for more options. WHAT FOODS ARE NOT RECOMMENDED? Grains White bread. Pasta made with refined flour. White rice. Vegetables Fried potatoes. Canned vegetables. Well-cooked vegetables.  Fruits Fruit juice. Cooked, strained fruit. Meats and Other Protein Sources Fatty cuts of meat. Fried Sales executive or fried fish. Dairy Milk. Yogurt. Cream cheese. Sour cream. Beverages Soft drinks. Other Cakes and pastries. Butter and oils. The items listed above may not be a complete list of foods and beverages to avoid. Contact your dietitian for more information. WHAT ARE SOME TIPS FOR INCLUDING HIGH-FIBER FOODS IN MY DIET?  Eat a wide variety of high-fiber foods.  Make  sure that half of all grains consumed each day are whole grains.  Replace breads and cereals made from refined flour or white flour with whole-grain breads and cereals.  Replace white rice with brown rice, bulgur wheat, or millet.  Start the day with a breakfast that is high in fiber, such as a cereal that contains at least 5 grams of fiber per serving.  Use beans in place of meat in soups, salads, or pasta.  Eat high-fiber snacks, such as berries, raw vegetables, nuts, or popcorn.   This information is not intended to replace advice given to you by your health care provider. Make sure you discuss any questions you have with your health care provider.   Document Released: 08/07/2005 Document Revised: 08/28/2014 Document Reviewed: 01/20/2014 Elsevier Interactive Patient Education [employer].

## 2016-03-09 ENCOUNTER — Encounter: Payer: Self-pay | Admitting: Family Medicine

## 2016-03-09 ENCOUNTER — Ambulatory Visit (INDEPENDENT_AMBULATORY_CARE_PROVIDER_SITE_OTHER): Payer: BC Managed Care – PPO | Admitting: Family Medicine

## 2016-03-09 ENCOUNTER — Other Ambulatory Visit: Payer: Self-pay

## 2016-03-09 VITALS — BP 126/80 | HR 74 | Wt 206.0 lb

## 2016-03-09 DIAGNOSIS — M76821 Posterior tibial tendinitis, right leg: Secondary | ICD-10-CM

## 2016-03-09 NOTE — Progress Notes (Signed)
[facility] [address], [place] [address] Phone: [phone] Subjective:    CC: Right ankle pain follow-up  [id number] Veneta D Herringshaw is a 50 y.o. female coming in with complaint She is also complaining of right ankle pain. Patient's was found to have a posterior tibialis tendinitis and was given an injection 8 months ago. Patient states that she has stopped doing the exercises, topical anti-inflammatories and icing. Patient is started coming back. Seems to be severe. Never gotten new shoes. Almost as bad as it was it was previously.     Past Medical History  Diagnosis Date  . Back complaints    Past Surgical History  Procedure Laterality Date  . Cesarean section    . Tubal ligation    . Knee surgery     Social History  Substance Use Topics  . Smoking status: Current Some Day Smoker    Types: Cigars  . Smokeless tobacco: None  . Alcohol Use: No   Allergies  Allergen Reactions  . Sulfa Antibiotics Hives  . Tetracyclines & Related Other (See Comments)    sunlight   No family history on file.   Past medical history, social, surgical and family history all reviewed in electronic medical record.   Review of Systems: No headache, visual changes, nausea, vomiting, diarrhea, constipation, dizziness, abdominal pain, skin rash, fevers, chills, night sweats, weight loss, swollen lymph nodes, body aches, joint swelling, muscle aches, chest pain, shortness of breath, mood changes.   Objective Blood pressure 126/80, pulse 74, weight 206 lb (93.441 kg).  General: No apparent distress alert and oriented x3 mood and affect normal, dressed appropriately.  HEENT: Pupils equal, extraocular movements intact  Respiratory: Patient's speak in full sentences and does not appear short of breath  Cardiovascular: No lower extremity edema, non tender, no erythema  Skin: Warm dry intact with no signs of infection or rash on extremities or on axial  skeleton.  Abdomen: Soft nontender  Neuro: Cranial nerves II through XII are intact, neurovascularly intact in all extremities with 2+ DTRs and 2+ pulses.  Lymph: No lymphadenopathy of posterior or anterior cervical chain or axillae bilaterally.  Gait mild antalgic gait MSK:  Non tender with full range of motion and good stability and symmetric strength and tone of shoulders, elbows, wrist, hips bilaterally.    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 Pes planus bilaterally. tender palpation over the posterior tibialis tendon on the right side. No signs of subluxation. Neurovascular intact distally.mild worsening from previous exam  Procedure: Real-time Ultrasound Guided Injection of right posterior tibialis tendon sheath injection Device: GE Logiq E  Ultrasound guided injection is preferred based studies that show increased duration, increased effect, greater accuracy, decreased procedural pain, increased response rate, and decreased cost with ultrasound guided versus blind injection.  Verbal informed consent obtained.  Time-out conducted.  Noted no overlying erythema, induration, or other signs of local infection.  Skin prepped in a sterile fashion.  Local anesthesia: Topical Ethyl chloride.  With sterile technique and under real time ultrasound guidance: With a 25-gauge 1 inch needle was injected with 0.5 mL of 0.5% Marcaine and 0.5 into the tendon sheath Pain immediately resolved suggesting accurate placement of the medication.  Advised to call if fevers/chills, erythema, induration, drainage, or persistent bleeding.  Images permanently stored and available for review in the ultrasound unit.  Impression: Technically successful ultrasound guided injection.  Impression and Recommendations:     This case required medical decision making of moderate complexity.

## 2016-03-09 NOTE — Patient Instructions (Signed)
Good to see you  Ice 20 minutes 2 times daily. Usually after activity and before bed. pennsaid pinkie amount topically 2 times daily as needed.  Good shoes with rigid bottom.  Jalene Mullet, Merrell or New balance greater then 700 Spenco orthotics "total support" online would be great  You know the drill Consider physical therapy  See me when you need me.

## 2016-03-09 NOTE — Assessment & Plan Note (Signed)
Patient was given another injection today. Tolerated the procedure well. We discussed the importance of the over-the-counter orthotics. Patient declined custom orthotics. We discussed weight loss. Discuss proper shoes. Given another refill of the topical anti-inflammatories. Follow-up again in 4 weeks for further evaluation and treatment. Patient also declined formal physical therapy.  Spent  25 minutes with patient face-to-face and had greater than 50% of counseling including as described above in assessment and plan.

## 2016-08-21 HISTORY — PX: POLYPECTOMY: SHX149

## 2016-08-21 HISTORY — PX: COLONOSCOPY: SHX174

## 2016-09-14 ENCOUNTER — Ambulatory Visit: Payer: BC Managed Care – PPO | Admitting: Family Medicine

## 2016-09-16 ENCOUNTER — Ambulatory Visit (HOSPITAL_COMMUNITY)
Admission: EM | Admit: 2016-09-16 | Discharge: 2016-09-16 | Discharge status: Absent without leave | Payer: BC Managed Care – PPO

## 2016-09-26 ENCOUNTER — Ambulatory Visit (INDEPENDENT_AMBULATORY_CARE_PROVIDER_SITE_OTHER): Payer: BC Managed Care – PPO | Admitting: Family Medicine

## 2016-09-26 ENCOUNTER — Encounter: Payer: Self-pay | Admitting: Family Medicine

## 2016-09-26 ENCOUNTER — Ambulatory Visit: Payer: Self-pay

## 2016-09-26 VITALS — BP 112/80 | HR 76 | Ht 65.0 in | Wt 215.0 lb

## 2016-09-26 DIAGNOSIS — M2242 Chondromalacia patellae, left knee: Secondary | ICD-10-CM | POA: Diagnosis not present

## 2016-09-26 DIAGNOSIS — M25562 Pain in left knee: Secondary | ICD-10-CM

## 2016-09-26 MED ORDER — DICLOFENAC SODIUM 2 % TD SOLN
TRANSDERMAL | 3 refills | Status: DC
Start: 2016-09-26 — End: 2017-09-20

## 2016-09-26 NOTE — Assessment & Plan Note (Addendum)
Patient does have arthritis noted in this area. Injected today. Patient given injection and tolerated the procedure well. We discussed icing regimen and home exercises. We discussed which activities to do a which was potentially avoid. Patient given topical anti-inflammatories and I think will be beneficial. We discussed the possibility of viscous supplementation. Changed oral anti-inflammatories with patient also not having response to the meloxicam. Follow-up again in 3-4 weeks. Could be a candidate for viscous supplementation.

## 2016-09-26 NOTE — Progress Notes (Signed)
[facility] [address], [place] [address] Phone: [phone] Subjective:     CC: Left knee pain follow-up  [id number]  Toni Kennedy is a 51 y.o. female coming in with complaint of bilateral knee pain. Past medical history significant for chondromalacia of the left knee. Patient has not had significant problems with this for quite some time. Patient states having significant pain on the anterior aspect of the knee. Worse with going up and down stairs. States that there is a grinding sensation. Intermittent swelling. Sometimes feels unstable. Rates the severity pain is 8 out of 10. Unable to walk regularly secondary to the pain. Can even sometimes wake her up at night.     Past Medical History:  Diagnosis Date  . Back complaints    Past Surgical History:  Procedure Laterality Date  . CESAREAN SECTION    . KNEE SURGERY    . TUBAL LIGATION     Social History   Social History  . Marital status: Divorced    Spouse name: N/A  . Number of children: N/A  . Years of education: N/A   Social History Main Topics  . Smoking status: Current Some Day Smoker    Types: Cigars  . Smokeless tobacco: Never Used  . Alcohol use No  . Drug use: Unknown  . Sexual activity: Not Asked   Other Topics Concern  . None   Social History Narrative  . None   Allergies  Allergen Reactions  . Sulfa Antibiotics Hives  . Tetracyclines & Related Other (See Comments)    sunlight   No family history on file.  Past medical history, social, surgical and family history all reviewed in electronic medical record.  No pertanent information unless stated regarding to the chief complaint.   Review of Systems:Review of systems updated and as accurate as of 09/26/16  No headache, visual changes, nausea, vomiting, diarrhea, constipation, dizziness, abdominal pain, skin rash, fevers, chills, night sweats, weight loss, swollen lymph nodes, body aches, joint  swelling, muscle aches, chest pain, shortness of breath, mood changes.   Objective  Blood pressure 112/80, pulse 76, height 5\' 5"  (1.651 m), weight 215 lb (97.5 kg), SpO2 99 %. Systems examined below as of 09/26/16   General: No apparent distress alert and oriented x3 mood and affect normal, dressed appropriately.  HEENT: Pupils equal, extraocular movements intact  Respiratory: Patient's speak in full sentences and does not appear short of breath  Cardiovascular: No lower extremity edema, non tender, no erythema  Skin: Warm dry intact with no signs of infection or rash on extremities or on axial skeleton.  Abdomen: Soft nontender  Neuro: Cranial nerves II through XII are intact, neurovascularly intact in all extremities with 2+ DTRs and 2+ pulses.  Lymph: No lymphadenopathy of posterior or anterior cervical chain or axillae bilaterally.  Gait antalgic gait  MSK:  Non tender with full range of motion and good stability and symmetric strength and tone of shoulders, elbows, wrist, hip, and ankles bilaterally.  Knee: Left Mild lateral tilt of the patella. Surgical incision noted Tender over the patellofemoral joint Lacks the last 10 degrees of flexion.  I'll instability noted. Negative Mcmurray's, Apley's, and Thessalonian tests. Non painful patellar compression. Patellar glide without crepitus. Patellar and quadriceps tendons unremarkable. Hamstring and quadriceps strength is normal.     After informed written and verbal consent, patient was seated on exam table. Left knee was prepped with alcohol swab and utilizing anterolateral approach,  patient's left knee space was injected with 4:1  marcaine 0.5%: Kenalog 40mg /dL. Patient tolerated the procedure well without immediate complications. Impression and Recommendations:     This case required medical decision making of moderate complexity.      Note: This dictation was prepared with Dragon dictation along with smaller phrase  technology. Any transcriptional errors that result from this process are unintentional.

## 2016-09-26 NOTE — Patient Instructions (Addendum)
Good to see you.  Ice 20 minutes 2 times daily. Usually after activity and before bed. pennsaid pinkie amount topically 2 times daily as needed.  Vitamin D 2000 IU daily  Turmeric 500mg  twice daily  Tart cherry extract any dose at night Exercises 3 times a week.  See me again in 3-4 weeks.

## 2016-09-27 ENCOUNTER — Telehealth: Payer: Self-pay | Admitting: *Deleted

## 2016-09-27 MED ORDER — TRAMADOL HCL 50 MG PO TABS
50.0000 mg | ORAL_TABLET | Freq: Two times a day (BID) | ORAL | 0 refills | Status: DC
Start: 2016-09-27 — End: 2017-03-14

## 2016-09-27 NOTE — Telephone Encounter (Signed)
Sounds like a flare,,  Will feel better tomorrow.  OK to take the meloxicam  Ice 20 minutes 2 times daily. Usually after activity and before bed. She has tramadol for breakthrough pain and take it with a tylenol.

## 2016-09-27 NOTE — Telephone Encounter (Signed)
Discussed with pt. rx faxed to pharmacy.

## 2016-09-27 NOTE — Telephone Encounter (Signed)
Pt called stating that she is in a lot of pain from her knee injection that she had yesterday. She states that she had difficulty walking while at the grocery store. Pt is requesting something be called in to help with her pain.

## 2016-10-18 ENCOUNTER — Ambulatory Visit: Payer: BC Managed Care – PPO | Admitting: Family Medicine

## 2016-10-18 NOTE — Progress Notes (Deleted)
  [name] [address], [place] [address] Phone: [phone] Subjective:     CC: Left knee pain follow-up  [id number]  Toni Kennedy is a 51 y.o. female coming in with complaint of bilateral knee pain. Past medical history significant for chondromalacia of the left knee. Patient was seen previously and was given an injection 4 weeks ago. Patient was to increase activity slowly. Patient states     Past Medical History:  Diagnosis Date  . Back complaints    Past Surgical History:  Procedure Laterality Date  . CESAREAN SECTION    . KNEE SURGERY    . TUBAL LIGATION     Social History   Social History  . Marital status: Divorced    Spouse name: N/A  . Number of children: N/A  . Years of education: N/A   Social History Main Topics  . Smoking status: Current Some Day Smoker    Types: Cigars  . Smokeless tobacco: Never Used  . Alcohol use No  . Drug use: Unknown  . Sexual activity: Not on file   Other Topics Concern  . Not on file   Social History Narrative  . No narrative on file   Allergies  Allergen Reactions  . Sulfa Antibiotics Hives  . Tetracyclines & Related Other (See Comments)    sunlight   No family history on file.  Past medical history, social, surgical and family history all reviewed in electronic medical record.  No pertanent information unless stated regarding to the chief complaint.   Review of Systems:Review of systems updated and as accurate as of 10/18/16  No headache, visual changes, nausea, vomiting, diarrhea, constipation, dizziness, abdominal pain, skin rash, fevers, chills, night sweats, weight loss, swollen lymph nodes, body aches, joint swelling, muscle aches, chest pain, shortness of breath, mood changes.   Objective  There were no vitals taken for this visit. Systems examined below as of 10/18/16   General: No apparent distress alert and oriented x3 mood and affect normal, dressed  appropriately.  HEENT: Pupils equal, extraocular movements intact  Respiratory: Patient's speak in full sentences and does not appear short of breath  Cardiovascular: No lower extremity edema, non tender, no erythema  Skin: Warm dry intact with no signs of infection or rash on extremities or on axial skeleton.  Abdomen: Soft nontender  Neuro: Cranial nerves II through XII are intact, neurovascularly intact in all extremities with 2+ DTRs and 2+ pulses.  Lymph: No lymphadenopathy of posterior or anterior cervical chain or axillae bilaterally.  Gait antalgic gait  MSK:  Non tender with full range of motion and good stability and symmetric strength and tone of shoulders, elbows, wrist, hip, and ankles bilaterally.  Knee: Left Mild lateral tilt of the patella. Surgical incision noted Tender over the patellofemoral joint Lacks the last 10 degrees of flexion.  I'll instability noted. Negative Mcmurray's, Apley's, and Thessalonian tests. Non painful patellar compression. Patellar glide without crepitus. Patellar and quadriceps tendons unremarkable. Hamstring and quadriceps strength is normal.      Impression and Recommendations:     This case required medical decision making of moderate complexity.      Note: This dictation was prepared with Dragon dictation along with smaller phrase technology. Any transcriptional errors that result from this process are unintentional.

## 2016-11-21 ENCOUNTER — Ambulatory Visit (HOSPITAL_COMMUNITY)
Admission: EM | Admit: 2016-11-21 | Discharge: 2016-11-21 | Disposition: A | Discharge status: Home / Self Care | Payer: BC Managed Care – PPO | Attending: Family Medicine | Admitting: Family Medicine

## 2016-11-21 ENCOUNTER — Encounter (HOSPITAL_COMMUNITY): Payer: Self-pay | Admitting: *Deleted

## 2016-11-21 DIAGNOSIS — J0100 Acute maxillary sinusitis, unspecified: Secondary | ICD-10-CM | POA: Diagnosis not present

## 2016-11-21 MED ORDER — FLUCONAZOLE 150 MG PO TABS: 150.0000 mg | ORAL_TABLET | Freq: Once | ORAL | 0 refills | Status: AC

## 2016-11-21 MED ORDER — PREDNISONE 20 MG PO TABS: ORAL_TABLET | ORAL | 0 refills | Status: DC

## 2016-11-21 MED ORDER — AMOXICILLIN-POT CLAVULANATE 875-125 MG PO TABS: 1.0000 | ORAL_TABLET | Freq: Two times a day (BID) | ORAL | 0 refills | Status: DC

## 2016-11-21 NOTE — ED Provider Notes (Signed)
[facility]    CSN: [id number] Arrival date & time: 11/21/16  1500     History   Chief Complaint Chief Complaint  Patient presents with  . URI    HPI Toni Kennedy is a 51 y.o. female.   Pt  Has   Symptoms  Of  Runny  Nose   Sinus  Congestion  And  Drainage  With   stuffyness  In nose   With  Symptoms    X  10   Days  Not  releived  By  otc  meds  sHe has a history of getting this every springtime.  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      Past Medical History:  Diagnosis Date  . Back complaints     Patient Active Problem List   Diagnosis Date Noted  . 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 Tibialis posterior tendinitis 02/16/2015    Past Surgical History:  Procedure Laterality Date  . CESAREAN SECTION    . KNEE SURGERY    . TUBAL LIGATION      OB History    No data available       Home Medications    Prior to Admission medications   Medication Sig Start Date End Date Taking? Authorizing Provider  amoxicillin-clavulanate (AUGMENTIN) 875-125 MG tablet Take 1 tablet by mouth every 12 (twelve) hours. 11/21/16   Robyn Haber, MD  Diclofenac Sodium 2 % SOLN Apply 1 pump onto the skin twice daily. 09/26/16   Lyndal Pulley, DO  diphenhydramine-acetaminophen (TYLENOL PM) 25-500 MG TABS Take 1-2 tablets by mouth at bedtime as needed (sleep).    Historical Provider, MD  fluconazole (DIFLUCAN) 150 MG tablet Take 1 tablet (150 mg total) by mouth once. Repeat if needed 11/21/16 11/21/16  Robyn Haber, MD  hydrocortisone (ANUSOL-HC) 2.5 % rectal cream Apply rectally 2 times daily 09/22/15   Konrad Felix, PA  meclizine (ANTIVERT) 25 MG tablet Take 1 tablet (25 mg total) by mouth 2 (two) times daily as needed for dizziness. 06/19/14   Everlene Balls, MD  meloxicam (MOBIC) 15 MG tablet Take 15 mg by mouth daily.    Historical Provider, MD  predniSONE (DELTASONE) 20 MG  tablet Two daily with food 11/21/16   Robyn Haber, MD  traMADol (ULTRAM) 50 MG tablet Take 1 tablet (50 mg total) by mouth 2 (two) times daily. 09/27/16   Lyndal Pulley, DO    Family History History reviewed. No pertinent family history.  Social History Social History  Substance Use Topics  . Smoking status: Current Some Day Smoker    Types: Cigars  . Smokeless tobacco: Never Used  . Alcohol use No     Allergies   Sulfa antibiotics and Tetracyclines & related   Review of Systems Review of Systems  Constitutional: Negative.   HENT: Positive for congestion and postnasal drip.   Respiratory: Negative.   Gastrointestinal: Negative.   Neurological: Negative.   All other systems reviewed and are negative.    Physical Exam Triage Vital Signs ED Triage Vitals  Enc Vitals Group     BP 11/21/16 1524 127/71     Pulse Rate 11/21/16 1524 60     Resp 11/21/16 1524 16     Temp 11/21/16 1524 98.5 F (36.9 C)     Temp src --      SpO2 11/21/16 1524 100 %  Weight --      Height --      Head Circumference --      Peak Flow --      Pain Score 11/21/16 1528 5     Pain Loc --      Pain Edu? --      Excl. in [place]? --    No data found.   Updated Vital Signs BP 127/71 (BP Location: Right Arm)   Pulse 60   Temp 98.5 F (36.9 C)   Resp 16   SpO2 100%   V Physical Exam  Constitutional: She is oriented to person, place, and time. She appears well-developed and well-nourished.  HENT:  Right Ear: External ear normal.  Left Ear: External ear normal.  Mouth/Throat: Oropharynx is clear and moist.  Eyes: Conjunctivae and EOM are normal. Pupils are equal, round, and reactive to light.  Neck: Normal range of motion. Neck supple.  Pulmonary/Chest: Effort normal.  Musculoskeletal: Normal range of motion.  Neurological: She is alert and oriented to person, place, and time.  Skin: Skin is warm and dry.  Nursing note and vitals reviewed.    UC Treatments / Results   Labs (all labs ordered are listed, but only abnormal results are displayed) Labs Reviewed - No data to display  EKG  EKG Interpretation None       Radiology No results found.  Procedures Procedures (including critical care time)  Medications Ordered in UC Medications - No data to display   Initial Impression / Assessment and Plan / UC Course  I have reviewed the triage vital signs and the nursing notes.  Pertinent labs & imaging results that were available during my care of the patient were reviewed by me and considered in my medical decision making (see chart for details).     Final Clinical Impressions(s) / UC Diagnoses   Final diagnoses:  Acute maxillary sinusitis, recurrence not specified    New Prescriptions New Prescriptions   AMOXICILLIN-CLAVULANATE (AUGMENTIN) 875-125 MG TABLET    Take 1 tablet by mouth every 12 (twelve) hours.   FLUCONAZOLE (DIFLUCAN) 150 MG TABLET    Take 1 tablet (150 mg total) by mouth once. Repeat if needed   PREDNISONE (DELTASONE) 20 MG TABLET    Two daily with food     Robyn Haber, MD 11/21/16 1538

## 2016-11-21 NOTE — ED Triage Notes (Signed)
Pt  Has   Symptoms  Of  Runny  Nose   Sinus  Congestion  And  Drainage  With   stuffyness  In nose   With  Symptoms    X  10   Days  Not  releived  By  otc  meds

## 2017-01-18 ENCOUNTER — Telehealth: Payer: Self-pay | Admitting: Family Medicine

## 2017-01-18 NOTE — Telephone Encounter (Signed)
Patient states she is still having pain and injections she does not believe is helping.  She does not have a PCP.  She would like to know if Dr. Tamala Julian could refer her to a pain management group.  Did notify patient that a normal referral time for pain management is 3 months.  Please follow up with patient in regard.

## 2017-01-18 NOTE — Telephone Encounter (Signed)
I would recommend ortho for the possibility of surgical intervention

## 2017-01-18 NOTE — Telephone Encounter (Signed)
lmovm for pt to return call.  

## 2017-03-05 ENCOUNTER — Ambulatory Visit (HOSPITAL_COMMUNITY)
Admission: EM | Admit: 2017-03-05 | Discharge: 2017-03-05 | Disposition: A | Discharge status: Home / Self Care | Payer: BC Managed Care – PPO | Attending: Family Medicine | Admitting: Family Medicine

## 2017-03-05 ENCOUNTER — Encounter (HOSPITAL_COMMUNITY): Payer: Self-pay | Admitting: *Deleted

## 2017-03-05 DIAGNOSIS — K625 Hemorrhage of anus and rectum: Secondary | ICD-10-CM

## 2017-03-05 NOTE — ED Provider Notes (Signed)
  [facility]   [id number] 03/05/17 Arrival Time: 1000  ASSESSMENT & PLAN:  Today you were diagnosed with the following: 1. Rectal bleeding    You have not been prescribed prescription medications this visit.   Taking stool softeners should help with the bleeding if indeed you have an internal hemorrhoid. But please note, it is very important for you to follow up with your primary care doctor in order to schedule a colonoscopy.  If you are not improving over the next few days or feel you are worsening please follow up here or the Emergency Department if you are unable to see your regular doctor.  Discussed importance of colonoscopy. Reviewed expectations re: course of current medical issues. Questions answered. Outlined signs and symptoms indicating need for more acute intervention. Patient verbalized understanding. After Visit Summary given.   SUBJECTIVE:  Toni Kennedy is a 51 y.o. female who presents with complaint of blood in toilet/stool for the past two weeks. Reports h/o hemorrhoids with bleeding. No rectal pain this time. Blood is "darker" she thinks but does see bright red blood. Strains a lot with BMs. No abdominal or flank pain reported. Appetite normal with normal PO intake. No n/v. Afebrile. No LMP recorded. Patient is not currently having periods (Reason: Perimenopausal). Weight stable. No specific h/o colon problems in family. OTC hemorrhoid cream without relief.  ROS: As per HPI.   OBJECTIVE:  Vitals:   03/05/17 1007  BP: 129/90  Pulse: 62  Resp: 16  Temp: 98.7 F (37.1 C)  TempSrc: Oral  SpO2: 96%     General appearance: alert; no distress Abdomen: soft, non-tender; bowel sounds normal; no masses or organomegaly; no guarding or rebound tenderness Back: no CVA tenderness Rectal: no external abnormality; no obvious blood or bleeding; no masses; tolerated exam well Extremities: no cyanosis or edema; symmetrical with no gross deformities Skin:  warm and dry   Allergies  Allergen Reactions  . Sulfa Antibiotics Hives  . Tetracyclines & Related Other (See Comments)    sunlight    PMHx, SurgHx, SocialHx, Medications, and Allergies were reviewed in the Visit Navigator and updated as appropriate.      Vanessa Kick, MD 03/05/17 [phone]

## 2017-03-05 NOTE — ED Triage Notes (Signed)
Rectal     Bleeding           Dark   At  Times         Pain  l   Side       Symptoms   X  2   Weeks

## 2017-03-05 NOTE — ED Notes (Signed)
Rectal exam

## 2017-03-05 NOTE — Discharge Instructions (Signed)
Today you were diagnosed with the following: 1. Rectal bleeding    You have not been prescribed prescription medications this visit.   Taking stool softeners should help with the bleeding if indeed you have an internal hemorrhoid. But please note, it is very important for you to follow up with your primary care doctor in order to schedule a colonoscopy.  If you are not improving over the next few days or feel you are worsening please follow up here or the Emergency Department if you are unable to see your regular doctor.

## 2017-03-14 ENCOUNTER — Encounter: Payer: Self-pay | Admitting: Family

## 2017-03-14 ENCOUNTER — Ambulatory Visit (INDEPENDENT_AMBULATORY_CARE_PROVIDER_SITE_OTHER): Payer: BC Managed Care – PPO | Admitting: Family

## 2017-03-14 VITALS — BP 120/82 | HR 62 | Temp 98.7°F | Resp 16 | Ht 65.0 in | Wt 216.0 lb

## 2017-03-14 DIAGNOSIS — E6609 Other obesity due to excess calories: Secondary | ICD-10-CM

## 2017-03-14 DIAGNOSIS — Z0000 Encounter for general adult medical examination without abnormal findings: Secondary | ICD-10-CM | POA: Insufficient documentation

## 2017-03-14 DIAGNOSIS — Z1211 Encounter for screening for malignant neoplasm of colon: Secondary | ICD-10-CM

## 2017-03-14 DIAGNOSIS — Z124 Encounter for screening for malignant neoplasm of cervix: Secondary | ICD-10-CM | POA: Diagnosis not present

## 2017-03-14 DIAGNOSIS — E669 Obesity, unspecified: Secondary | ICD-10-CM | POA: Insufficient documentation

## 2017-03-14 DIAGNOSIS — Z6834 Body mass index (BMI) 34.0-34.9, adult: Secondary | ICD-10-CM

## 2017-03-14 DIAGNOSIS — Z1231 Encounter for screening mammogram for malignant neoplasm of breast: Secondary | ICD-10-CM | POA: Diagnosis not present

## 2017-03-14 HISTORY — DX: Encounter for general adult medical examination without abnormal findings: Z00.00

## 2017-03-14 MED ORDER — HYDROCORTISONE ACETATE 25 MG RE SUPP: 25.0000 mg | Freq: Two times a day (BID) | RECTAL | 0 refills | Status: DC

## 2017-03-14 NOTE — Patient Instructions (Addendum)
Thank you for choosing [facility].  SUMMARY AND INSTRUCTIONS:  Please ensure to drink plenty of fluid water.   Goal fiber intake of 20-25 grams per day.  Colace (docusate sodium) as needed for hard stools.  Avoid excessive wiping and consider using baby wipes.   Eat when hungry.   Work on a Copywriter, advertising style intake as below.   Medication:  Your prescription(s) have been submitted to your pharmacy or been printed and provided for you. Please take as directed and contact our office if you believe you are having problem(s) with the medication(s) or have any questions.  Labs:  Please stop by the lab on the lower level of the building for your blood work. Your results will be released to Max (or called to you) after review, usually within 72 hours after test completion. If any changes need to be made, you will be notified at that same time.  1.) The lab is open from 7:30am to 5:30 pm Monday-Friday 2.) No appointment is necessary 3.) Fasting (if needed) is 6-8 hours after food and drink; black coffee and water are okay   Referrals:  Referrals have been made during this visit. You should expect to hear back from our schedulers in about 7-10 days in regards to establishing an appointment with the specialists we discussed.   Follow up:  If your symptoms worsen or fail to improve, please contact our office for further instruction, or in case of emergency go directly to the emergency room at the closest medical facility.   Health Maintenance, Female Adopting a healthy lifestyle and getting preventive care can go a long way to promote health and wellness. Talk with your health care provider about what schedule of regular examinations is right for you. This is a good chance for you to check in with your provider about disease prevention and staying healthy. In between checkups, there are plenty of things you can do on your own. Experts have done a lot of research about which  lifestyle changes and preventive measures are most likely to keep you healthy. Ask your health care provider for more information. Weight and diet Eat a healthy diet  Be sure to include plenty of vegetables, fruits, low-fat dairy products, and lean protein.  Do not eat a lot of foods high in solid fats, added sugars, or salt.  Get regular exercise. This is one of the most important things you can do for your health. ? Most adults should exercise for at least 150 minutes each week. The exercise should increase your heart rate and make you sweat (moderate-intensity exercise). ? Most adults should also do strengthening exercises at least twice a week. This is in addition to the moderate-intensity exercise.  Maintain a healthy weight  Body mass index (BMI) is a measurement that can be used to identify possible weight problems. It estimates body fat based on height and weight. Your health care provider can help determine your BMI and help you achieve or maintain a healthy weight.  For females 87 years of age and older: ? A BMI below 18.5 is considered underweight. ? A BMI of 18.5 to 24.9 is normal. ? A BMI of 25 to 29.9 is considered overweight. ? A BMI of 30 and above is considered obese.  Watch levels of cholesterol and blood lipids  You should start having your blood tested for lipids and cholesterol at 50 years of age, then have this test every 5 years.  You may need to have your  cholesterol levels checked more often if: ? Your lipid or cholesterol levels are high. ? You are older than 50 years of age. ? You are at high risk for heart disease.  Cancer screening Lung Cancer  Lung cancer screening is recommended for adults 90-37 years old who are at high risk for lung cancer because of a history of smoking.  A yearly low-dose CT scan of the lungs is recommended for people who: ? Currently smoke. ? Have quit within the past 15 years. ? Have at least a 30-pack-year history of  smoking. A pack year is smoking an average of one pack of cigarettes a day for 1 year.  Yearly screening should continue until it has been 15 years since you quit.  Yearly screening should stop if you develop a health problem that would prevent you from having lung cancer treatment.  Breast Cancer  Practice breast self-awareness. This means understanding how your breasts normally appear and feel.  It also means doing regular breast self-exams. Let your health care provider know about any changes, no matter how small.  If you are in your 20s or 30s, you should have a clinical breast exam (CBE) by a health care provider every 1-3 years as part of a regular health exam.  If you are 63 or older, have a CBE every year. Also consider having a breast X-ray (mammogram) every year.  If you have a family history of breast cancer, talk to your health care provider about genetic screening.  If you are at high risk for breast cancer, talk to your health care provider about having an MRI and a mammogram every year.  Breast cancer gene (BRCA) assessment is recommended for women who have family members with BRCA-related cancers. BRCA-related cancers include: ? Breast. ? Ovarian. ? Tubal. ? Peritoneal cancers.  Results of the assessment will determine the need for genetic counseling and BRCA1 and BRCA2 testing.  Cervical Cancer Your health care provider may recommend that you be screened regularly for cancer of the pelvic organs (ovaries, uterus, and vagina). This screening involves a pelvic examination, including checking for microscopic changes to the surface of your cervix (Pap test). You may be encouraged to have this screening done every 3 years, beginning at age 95.  For women ages 55-65, health care providers may recommend pelvic exams and Pap testing every 3 years, or they may recommend the Pap and pelvic exam, combined with testing for human papilloma virus (HPV), every 5 years. Some types of  HPV increase your risk of cervical cancer. Testing for HPV may also be done on women of any age with unclear Pap test results.  Other health care providers may not recommend any screening for nonpregnant women who are considered low risk for pelvic cancer and who do not have symptoms. Ask your health care provider if a screening pelvic exam is right for you.  If you have had past treatment for cervical cancer or a condition that could lead to cancer, you need Pap tests and screening for cancer for at least 20 years after your treatment. If Pap tests have been discontinued, your risk factors (such as having a new sexual partner) need to be reassessed to determine if screening should resume. Some women have medical problems that increase the chance of getting cervical cancer. In these cases, your health care provider may recommend more frequent screening and Pap tests.  Colorectal Cancer  This type of cancer can be detected and often prevented.  Routine colorectal  cancer screening usually begins at 50 years of age and continues through 50 years of age.  Your health care provider may recommend screening at an earlier age if you have risk factors for colon cancer.  Your health care provider may also recommend using home test kits to check for hidden blood in the stool.  A small camera at the end of a tube can be used to examine your colon directly (sigmoidoscopy or colonoscopy). This is done to check for the earliest forms of colorectal cancer.  Routine screening usually begins at age 70.  Direct examination of the colon should be repeated every 5-10 years through 50 years of age. However, you may need to be screened more often if early forms of precancerous polyps or small growths are found.  Skin Cancer  Check your skin from head to toe regularly.  Tell your health care provider about any new moles or changes in moles, especially if there is a change in a mole's shape or color.  Also tell  your health care provider if you have a mole that is larger than the size of a pencil eraser.  Always use sunscreen. Apply sunscreen liberally and repeatedly throughout the day.  Protect yourself by wearing long sleeves, pants, a wide-brimmed hat, and sunglasses whenever you are outside.  Heart disease, diabetes, and high blood pressure  High blood pressure causes heart disease and increases the risk of stroke. High blood pressure is more likely to develop in: ? People who have blood pressure in the high end of the normal range (130-139/85-89 mm Hg). ? People who are overweight or obese. ? People who are African American.  If you are 18-39 years of age, have your blood pressure checked every 3-5 years. If you are 54 years of age or older, have your blood pressure checked every year. You should have your blood pressure measured twice-once when you are at a hospital or clinic, and once when you are not at a hospital or clinic. Record the average of the two measurements. To check your blood pressure when you are not at a hospital or clinic, you can use: ? An automated blood pressure machine at a pharmacy. ? A home blood pressure monitor.  If you are between 61 years and 10 years old, ask your health care provider if you should take aspirin to prevent strokes.  Have regular diabetes screenings. This involves taking a blood sample to check your fasting blood sugar level. ? If you are at a normal weight and have a low risk for diabetes, have this test once every three years after 50 years of age. ? If you are overweight and have a high risk for diabetes, consider being tested at a younger age or more often. Preventing infection Hepatitis B  If you have a higher risk for hepatitis B, you should be screened for this virus. You are considered at high risk for hepatitis B if: ? You were born in a country where hepatitis B is common. Ask your health care provider which countries are considered high  risk. ? Your parents were born in a high-risk country, and you have not been immunized against hepatitis B (hepatitis B vaccine). ? You have HIV or AIDS. ? You use needles to inject street drugs. ? You live with someone who has hepatitis B. ? You have had sex with someone who has hepatitis B. ? You get hemodialysis treatment. ? You take certain medicines for conditions, including cancer, organ transplantation, and  autoimmune conditions.  Hepatitis C  Blood testing is recommended for: ? Everyone born from [birth year] through 1964. ? Anyone with known risk factors for hepatitis C.  Sexually transmitted infections (STIs)  You should be screened for sexually transmitted infections (STIs) including gonorrhea and chlamydia if: ? You are sexually active and are younger than 50 years of age. ? You are older than 50 years of age and your health care provider tells you that you are at risk for this type of infection. ? Your sexual activity has changed since you were last screened and you are at an increased risk for chlamydia or gonorrhea. Ask your health care provider if you are at risk.  If you do not have HIV, but are at risk, it may be recommended that you take a prescription medicine daily to prevent HIV infection. This is called pre-exposure prophylaxis (PrEP). You are considered at risk if: ? You are sexually active and do not regularly use condoms or know the HIV status of your partner(s). ? You take drugs by injection. ? You are sexually active with a partner who has HIV.  Talk with your health care provider about whether you are at high risk of being infected with HIV. If you choose to begin PrEP, you should first be tested for HIV. You should then be tested every 3 months for as long as you are taking PrEP. Pregnancy  If you are premenopausal and you may become pregnant, ask your health care provider about preconception counseling.  If you may become pregnant, take 400 to 800 micrograms  (mcg) of folic acid every day.  If you want to prevent pregnancy, talk to your health care provider about birth control (contraception). Osteoporosis and menopause  Osteoporosis is a disease in which the bones lose minerals and strength with aging. This can result in serious bone fractures. Your risk for osteoporosis can be identified using a bone density scan.  If you are 54 years of age or older, or if you are at risk for osteoporosis and fractures, ask your health care provider if you should be screened.  Ask your health care provider whether you should take a calcium or vitamin D supplement to lower your risk for osteoporosis.  Menopause may have certain physical symptoms and risks.  Hormone replacement therapy may reduce some of these symptoms and risks. Talk to your health care provider about whether hormone replacement therapy is right for you. Follow these instructions at home:  Schedule regular health, dental, and eye exams.  Stay current with your immunizations.  Do not use any tobacco products including cigarettes, chewing tobacco, or electronic cigarettes.  If you are pregnant, do not drink alcohol.  If you are breastfeeding, limit how much and how often you drink alcohol.  Limit alcohol intake to no more than 1 drink per day for nonpregnant women. One drink equals 12 ounces of beer, 5 ounces of wine, or 1 ounces of hard liquor.  Do not use street drugs.  Do not share needles.  Ask your health care provider for help if you need support or information about quitting drugs.  Tell your health care provider if you often feel depressed.  Tell your health care provider if you have ever been abused or do not feel safe at home. This information is not intended to replace advice given to you by your health care provider. Make sure you discuss any questions you have with your health care provider. Document Released: 02/20/2011 Document Revised: 01/13/2016  Document Reviewed:  05/11/2015 Elsevier Interactive Patient Education  [employer].     Why follow it? Research shows. . Those who follow the Mediterranean diet have a reduced risk of heart disease  . The diet is associated with a reduced incidence of Parkinson's and Alzheimer's diseases . People following the diet may have longer life expectancies and lower rates of chronic diseases  . The Dietary Guidelines for Americans recommends the Mediterranean diet as an eating plan to promote health and prevent disease  What Is the Mediterranean Diet?  . Healthy eating plan based on typical foods and recipes of Mediterranean-style cooking . The diet is primarily a plant based diet; these foods should make up a majority of meals   Starches - Plant based foods should make up a majority of meals - They are an important sources of vitamins, minerals, energy, antioxidants, and fiber - Choose whole grains, foods high in fiber and minimally processed items  - Typical grain sources include wheat, oats, barley, corn, brown rice, bulgar, farro, millet, polenta, couscous  - Various types of beans include chickpeas, lentils, fava beans, black beans, white beans   Fruits  Veggies - Large quantities of antioxidant rich fruits & veggies; 6 or more servings  - Vegetables can be eaten raw or lightly drizzled with oil and cooked  - Vegetables common to the traditional Mediterranean Diet include: artichokes, arugula, beets, broccoli, brussel sprouts, cabbage, carrots, celery, collard greens, cucumbers, eggplant, kale, leeks, lemons, lettuce, mushrooms, okra, onions, peas, peppers, potatoes, pumpkin, radishes, rutabaga, shallots, spinach, sweet potatoes, turnips, zucchini - Fruits common to the Mediterranean Diet include: apples, apricots, avocados, cherries, clementines, dates, figs, grapefruits, grapes, melons, nectarines, oranges, peaches, pears, pomegranates, strawberries, tangerines  Fats - Replace butter and margarine with  healthy oils, such as olive oil, canola oil, and tahini  - Limit nuts to no more than a handful a day  - Nuts include walnuts, almonds, pecans, pistachios, pine nuts  - Limit or avoid candied, honey roasted or heavily salted nuts - Olives are central to the [employer] - can be eaten whole or used in a variety of dishes   Meats Protein - Limiting red meat: no more than a few times a month - When eating red meat: choose lean cuts and keep the portion to the size of deck of cards - Eggs: approx. 0 to 4 times a week  - Fish and lean poultry: at least 2 a week  - Healthy protein sources include, chicken, [place], lean beef, lamb - Increase intake of seafood such as tuna, salmon, trout, mackerel, shrimp, scallops - Avoid or limit high fat processed meats such as sausage and bacon  Dairy - Include moderate amounts of low fat dairy products  - Focus on healthy dairy such as fat free yogurt, skim milk, low or reduced fat cheese - Limit dairy products higher in fat such as whole or 2% milk, cheese, ice cream  Alcohol - Moderate amounts of red wine is ok  - No more than 5 oz daily for women (all ages) and men older than age 10  - No more than 10 oz of wine daily for men younger than 61  Other - Limit sweets and other desserts  - Use herbs and spices instead of salt to flavor foods  - Herbs and spices common to the traditional Mediterranean Diet include: basil, bay leaves, chives, cloves, cumin, fennel, garlic, lavender, marjoram, mint, oregano, parsley, pepper, rosemary, sage, savory, sumac, tarragon, thyme  It's not just a diet, it's a lifestyle:  . The Mediterranean diet includes lifestyle factors typical of those in the region  . Foods, drinks and meals are best eaten with others and savored . Daily physical activity is important for overall good health . This could be strenuous exercise like running and aerobics . This could also be more leisurely activities such as walking, housework,  yard-work, or taking the stairs . Moderation is the key; a balanced and healthy diet accommodates most foods and drinks . Consider portion sizes and frequency of consumption of certain foods   Meal Ideas & Options:  . Breakfast:  o Whole wheat toast or whole wheat English muffins with peanut butter & hard boiled egg o Steel cut oats topped with apples & cinnamon and skim milk  o Fresh fruit: banana, strawberries, melon, berries, peaches  o Smoothies: strawberries, bananas, greek yogurt, peanut butter o Low fat greek yogurt with blueberries and granola  o Egg white omelet with spinach and mushrooms o Breakfast couscous: whole wheat couscous, apricots, skim milk, cranberries  . Sandwiches:  o Hummus and grilled vegetables (peppers, zucchini, squash) on whole wheat bread   o Grilled chicken on whole wheat pita with lettuce, tomatoes, cucumbers or tzatziki  o Tuna salad on whole wheat bread: tuna salad made with greek yogurt, olives, red peppers, capers, green onions o Garlic rosemary lamb pita: lamb sauted with garlic, rosemary, salt & pepper; add lettuce, cucumber, greek yogurt to pita - flavor with lemon juice and black pepper  . Seafood:  o Mediterranean grilled salmon, seasoned with garlic, basil, parsley, lemon juice and black pepper o Shrimp, lemon, and spinach whole-grain pasta salad made with low fat greek yogurt  o Seared scallops with lemon orzo  o Seared tuna steaks seasoned salt, pepper, coriander topped with tomato mixture of olives, tomatoes, olive oil, minced garlic, parsley, green onions and cappers  . Meats:  o Herbed greek chicken salad with kalamata olives, cucumber, feta  o Red bell peppers stuffed with spinach, bulgur, lean ground beef (or lentils) & topped with feta   o Kebabs: skewers of chicken, tomatoes, onions, zucchini, squash  o [place] burgers: made with red onions, mint, dill, lemon juice, feta cheese topped with roasted red peppers . Vegetarian o Cucumber  salad: cucumbers, artichoke hearts, celery, red onion, feta cheese, tossed in olive oil & lemon juice  o Hummus and whole grain pita points with a greek salad (lettuce, tomato, feta, olives, cucumbers, red onion) o Lentil soup with celery, carrots made with vegetable broth, garlic, salt and pepper  o Tabouli salad: parsley, bulgur, mint, scallions, cucumbers, tomato, radishes, lemon juice, olive oil, salt and pepper.

## 2017-03-14 NOTE — Assessment & Plan Note (Signed)
1) Anticipatory Guidance: Discussed importance of wearing a seatbelt while driving and not texting while driving; changing batteries in smoke detector at least once annually; wearing suntan lotion when outside; eating a balanced and moderate diet; getting physical activity at least 30 minutes per day.  2) Immunizations / Screenings / Labs:  Declines tetanus. All other immunizations are up-to-date per recommendations. Due for cervical cancer and breast cancer screenings with referral placed to gynecology and for mammogram. Due for colon cancer screening with referral to gastroenterology placed. Due for a vision exam encouraged to be completed independently. All other screenings are up-to-date per recommendations. Obtain CBC, CMET, and lipid profile.    Overall well exam with risk factors for cardiovascular disease including obesity and mild tobacco use. Recommend weight loss of 5-10% of current body weight through nutrition and physical activity. Encouraged nutritional intake that is moderate, balance, and varied. Goal of 30 minutes of moderate level activity daily or approximately 10,000 steps. Continue other healthy lifestyle behaviors and choices. Follow-up prevention exam in 1 year. Follow up office visit pending blood work and for chronic conditions as needed.

## 2017-03-14 NOTE — Progress Notes (Signed)
Subjective:    Patient ID: Toni Kennedy, female    DOB: 05-Apr-1966, 51 y.o.   MRN: [id number]  Chief Complaint  Patient presents with  . Establish Care    CPE, not fasting     HPI:  Toni Kennedy is a 51 y.o. female who presents today for an annual wellness visit.   1) Health Maintenance -   Diet - Averages about 1-2 meals per day consisting of a regular diet; Caffeine intake of about 3-4 cups per day  Exercise - No structured exercise    2) Preventative Exams / Immunizations:  Dental -- Up to date  Vision -- Due for exam    Health Maintenance  Topic Date Due  . HIV Screening  01/02/1982  . TETANUS/TDAP  01/02/1986  . PAP SMEAR  01/03/1988  . MAMMOGRAM  01/02/2017  . COLONOSCOPY  01/02/2017  . INFLUENZA VACCINE  03/21/2017     There is no immunization history on file for this patient.   Allergies  Allergen Reactions  . Sulfa Antibiotics Hives  . Tetracyclines & Related Other (See Comments)    sunlight     Outpatient Medications Prior to Visit  Medication Sig Dispense Refill  . Diclofenac Sodium 2 % SOLN Apply 1 pump onto the skin twice daily. 112 g 3  . diphenhydramine-acetaminophen (TYLENOL PM) 25-500 MG TABS Take 1-2 tablets by mouth at bedtime as needed (sleep).    . meloxicam (MOBIC) 15 MG tablet Take 15 mg by mouth daily.    Marland Kitchen amoxicillin-clavulanate (AUGMENTIN) 875-125 MG tablet Take 1 tablet by mouth every 12 (twelve) hours. 14 tablet 0  . hydrocortisone (ANUSOL-HC) 2.5 % rectal cream Apply rectally 2 times daily 30 g 0  . meclizine (ANTIVERT) 25 MG tablet Take 1 tablet (25 mg total) by mouth 2 (two) times daily as needed for dizziness. 14 tablet 0  . predniSONE (DELTASONE) 20 MG tablet Two daily with food 10 tablet 0  . traMADol (ULTRAM) 50 MG tablet Take 1 tablet (50 mg total) by mouth 2 (two) times daily. 20 tablet 0   No facility-administered medications prior to visit.      Past Medical History:  Diagnosis Date  . Arthritis   .  Back complaints   . Blood in stool   . Depression   . Hemorrhoid      Past Surgical History:  Procedure Laterality Date  . CESAREAN SECTION    . KNEE SURGERY    . TUBAL LIGATION       Family History  Problem Relation Age of Onset  . Arthritis Mother   . Hyperlipidemia Mother   . Hypertension Mother   . Diabetes Mother   . Heart disease Father   . Heart disease Maternal Grandmother      Social History   Social History  . Marital status: Divorced    Spouse name: N/A  . Number of children: 2  . Years of education: 14   Occupational History  . Designer, industrial/product    Social History Main Topics  . Smoking status: Current Some Day Smoker    Types: Cigars  . Smokeless tobacco: Never Used  . Alcohol use No  . Drug use: No  . Sexual activity: Not on file   Other Topics Concern  . Not on file   Social History Narrative   Fun/Hobby: Play sweepstakes   Denies abuse and feels safe at home.       Review of Systems  Constitutional:  Denies fever, chills, fatigue, or significant weight gain/loss. HENT: Head: Denies headache or neck pain Ears: Denies changes in hearing, ringing in ears, earache, drainage Nose: Denies discharge, stuffiness, itching, nosebleed, sinus pain Throat: Denies sore throat, hoarseness, dry mouth, sores, thrush Eyes: Denies loss/changes in vision, pain, redness, blurry/double vision, flashing lights Cardiovascular: Denies chest pain/discomfort, tightness, palpitations, shortness of breath with activity, difficulty lying down, swelling, sudden awakening with shortness of breath Respiratory: Denies shortness of breath, cough, sputum production, wheezing Gastrointestinal: Denies dysphasia, heartburn, change in appetite, nausea, change in bowel habits, rectal bleeding, constipation, diarrhea, yellow skin or eyes Genitourinary: Denies frequency, urgency, burning/pain, blood in urine, incontinence, change in urinary strength. Musculoskeletal: Denies  muscle/joint pain, stiffness, back pain, redness or swelling of joints, trauma Skin: Denies rashes, lumps, itching, dryness, color changes, or hair/nail changes Neurological: Denies dizziness, fainting, seizures, weakness, numbness, tingling, tremor Psychiatric - Denies nervousness, stress, depression or memory loss Endocrine: Denies heat or cold intolerance, sweating, frequent urination, excessive thirst, changes in appetite Hematologic: Denies ease of bruising or bleeding     Objective:     BP 120/82 (BP Location: Left Arm, Patient Position: Sitting, Cuff Size: Large)   Pulse 62   Temp 98.7 F (37.1 C) (Oral)   Resp 16   Ht 5\' 5"  (1.651 m)   Wt 216 lb (98 kg)   SpO2 98%   BMI 35.94 kg/m  Nursing note and vital signs reviewed.  Physical Exam  Constitutional: She is oriented to person, place, and time. She appears well-developed and well-nourished. No distress.  HENT:  Head: Normocephalic.  Right Ear: Hearing, tympanic membrane, external ear and ear canal normal.  Left Ear: Hearing, tympanic membrane, external ear and ear canal normal.  Nose: Nose normal.  Mouth/Throat: Uvula is midline, oropharynx is clear and moist and mucous membranes are normal.  Eyes: Pupils are equal, round, and reactive to light. Conjunctivae and EOM are normal.  Neck: Neck supple. No JVD present. No tracheal deviation present. No thyromegaly present.  Cardiovascular: Normal rate, regular rhythm, normal heart sounds and intact distal pulses.   Pulmonary/Chest: Effort normal and breath sounds normal.  Abdominal: Soft. Bowel sounds are normal. She exhibits no distension and no mass. There is no tenderness. There is no rebound and no guarding.  Musculoskeletal: Normal range of motion. She exhibits no edema or tenderness.  Lymphadenopathy:    She has no cervical adenopathy.  Neurological: She is alert and oriented to person, place, and time. She has normal reflexes. No cranial nerve deficit. She exhibits  normal muscle tone. Coordination normal.  Skin: Skin is warm and dry.  Psychiatric: She has a normal mood and affect. Her behavior is normal. Judgment and thought content normal.       Assessment & Plan:   Problem List Items Addressed This Visit      Other   Routine adult health maintenance - Primary    1) Anticipatory Guidance: Discussed importance of wearing a seatbelt while driving and not texting while driving; changing batteries in smoke detector at least once annually; wearing suntan lotion when outside; eating a balanced and moderate diet; getting physical activity at least 30 minutes per day.  2) Immunizations / Screenings / Labs:  Declines tetanus. All other immunizations are up-to-date per recommendations. Due for cervical cancer and breast cancer screenings with referral placed to gynecology and for mammogram. Due for colon cancer screening with referral to gastroenterology placed. Due for a vision exam encouraged to be completed independently. All other  screenings are up-to-date per recommendations. Obtain CBC, CMET, and lipid profile.    Overall well exam with risk factors for cardiovascular disease including obesity and mild tobacco use. Recommend weight loss of 5-10% of current body weight through nutrition and physical activity. Encouraged nutritional intake that is moderate, balance, and varied. Goal of 30 minutes of moderate level activity daily or approximately 10,000 steps. Continue other healthy lifestyle behaviors and choices. Follow-up prevention exam in 1 year. Follow up office visit pending blood work and for chronic conditions as needed.      Relevant Orders   CBC   Comprehensive metabolic panel   Lipid panel   Obesity    BMI of 34. Recommend weight loss of 5-10% of current body weight. Recommend increasing physical activity to 30 minutes of moderate level activity daily. Encourage nutritional intake that focuses on nutrient dense foods and is moderate, varied, and  balanced and is low in saturated fats and processed/sugary foods. Continue to monitor.         Other Visit Diagnoses    Colon cancer screening       Relevant Orders   Ambulatory referral to Gastroenterology   Cervical cancer screening       Relevant Orders   Ambulatory referral to Gynecology   Encounter for screening mammogram for breast cancer       Relevant Orders   MM DIGITAL SCREENING BILATERAL        I have discontinued Ms. Sayer's meclizine, hydrocortisone, traMADol, amoxicillin-clavulanate, and predniSONE. I am also having her start on hydrocortisone. Additionally, I am having her maintain her meloxicam, diphenhydramine-acetaminophen, and Diclofenac Sodium.   Meds ordered this encounter  Medications  . hydrocortisone (ANUSOL-HC) 25 MG suppository    Sig: Place 1 suppository (25 mg total) rectally 2 (two) times daily.    Dispense:  12 suppository    Refill:  0    Order Specific Question:   Supervising Provider    Answer:   Pricilla Holm A [[id number]]     Follow-up: Return in about 6 months (around 09/14/2017), or if symptoms worsen or fail to improve.   Mauricio Po, FNP

## 2017-03-14 NOTE — Assessment & Plan Note (Signed)
BMI of 34. Recommend weight loss of 5-10% of current body weight. Recommend increasing physical activity to 30 minutes of moderate level activity daily. Encourage nutritional intake that focuses on nutrient dense foods and is moderate, varied, and balanced and is low in saturated fats and processed/sugary foods. Continue to monitor.  

## 2017-03-19 ENCOUNTER — Other Ambulatory Visit (INDEPENDENT_AMBULATORY_CARE_PROVIDER_SITE_OTHER): Payer: BC Managed Care – PPO

## 2017-03-19 DIAGNOSIS — Z0000 Encounter for general adult medical examination without abnormal findings: Secondary | ICD-10-CM | POA: Diagnosis not present

## 2017-03-19 LAB — COMPREHENSIVE METABOLIC PANEL
ALT: 9 U/L (ref 0–35)
AST: 12 U/L (ref 0–37)
Albumin: 4 g/dL (ref 3.5–5.2)
Alkaline Phosphatase: 75 U/L (ref 39–117)
BILIRUBIN TOTAL: 0.4 mg/dL (ref 0.2–1.2)
BUN: 10 mg/dL (ref 6–23)
CHLORIDE: 105 meq/L (ref 96–112)
CO2: 28 meq/L (ref 19–32)
CREATININE: 0.78 mg/dL (ref 0.40–1.20)
Calcium: 9.2 mg/dL (ref 8.4–10.5)
GFR: 100.45 mL/min (ref >60.00)
GLUCOSE: 99 mg/dL (ref 70–99)
Potassium: 3.8 mEq/L (ref 3.5–5.1)
SODIUM: 140 meq/L (ref 135–145)
Total Protein: 7.1 g/dL (ref 6.0–8.3)

## 2017-03-19 LAB — CBC
HCT: 40.6 % (ref 36.0–46.0)
Hemoglobin: 12.8 g/dL (ref 12.0–15.0)
MCHC: 31.4 g/dL (ref 30.0–36.0)
MCV: 84.2 fl (ref 78.0–100.0)
Platelets: 234 10*3/uL (ref 150.0–400.0)
RBC: 4.83 Mil/uL (ref 3.87–5.11)
RDW: 16.7 % — AB (ref 11.5–15.5)
WBC: 4.6 10*3/uL (ref 4.0–10.5)

## 2017-03-19 LAB — LIPID PANEL
Cholesterol: 204 mg/dL — ABNORMAL HIGH (ref 0–200)
HDL: 46.5 mg/dL (ref >39.00)
LDL CALC: 130 mg/dL — AB (ref 0–99)
NONHDL: 157.6
Total CHOL/HDL Ratio: 4
Triglycerides: 136 mg/dL (ref 0.0–149.0)
VLDL: 27.2 mg/dL (ref 0.0–40.0)

## 2017-03-27 ENCOUNTER — Encounter: Payer: Self-pay | Admitting: Gastroenterology

## 2017-03-27 ENCOUNTER — Ambulatory Visit (INDEPENDENT_AMBULATORY_CARE_PROVIDER_SITE_OTHER): Payer: BC Managed Care – PPO | Admitting: Gastroenterology

## 2017-03-27 VITALS — BP 120/78 | HR 76 | Ht 64.25 in | Wt 216.2 lb

## 2017-03-27 DIAGNOSIS — Z1211 Encounter for screening for malignant neoplasm of colon: Secondary | ICD-10-CM | POA: Insufficient documentation

## 2017-03-27 DIAGNOSIS — K644 Residual hemorrhoidal skin tags: Secondary | ICD-10-CM | POA: Insufficient documentation

## 2017-03-27 DIAGNOSIS — K625 Hemorrhage of anus and rectum: Secondary | ICD-10-CM | POA: Diagnosis not present

## 2017-03-27 HISTORY — DX: Residual hemorrhoidal skin tags: K64.4

## 2017-03-27 HISTORY — DX: Hemorrhage of anus and rectum: K62.5

## 2017-03-27 HISTORY — DX: Encounter for screening for malignant neoplasm of colon: Z12.11

## 2017-03-27 MED ORDER — NA SULFATE-K SULFATE-MG SULF 17.5-3.13-1.6 GM/177ML PO SOLN: ORAL | 0 refills | Status: DC

## 2017-03-27 NOTE — Progress Notes (Signed)
[birth year]66 Toni Kennedy [id number] [birth year]   HISTORY OF PRESENT ILLNESS:  This is a pleasant 51 year old female who is new to our practice.  She was referred to our office by Terri Piedra, NP, for discussion regarding screening colonoscopy and also for evaluation regarding some recent rectal bleeding. She's never had colonoscopy in the past. She says that about 4 weeks ago she started having bright red blood in her stool. This occurred with almost every bowel movement for about 3 weeks.  She describes it as being bright red blood, but was quite a bit of blood on several occasions. Only occurred with bowel movements. She admits that she has always stayed more on the constipated side, but overall moves her bowels regularly. She has recently just started using some stool softeners. She denies any rectal pain or discomfort. Denies any abdominal pain. Recent CBC was normal.  Says that she has known that she had external hemorrhoid in the past.  Past Medical History:  Diagnosis Date  . Anemia   . Anxiety   . Arthritis   . Back complaints   . Blood in stool   . Cardiac arrhythmia   . Depression   . Hemorrhoid   . Scoliosis    Past Surgical History:  Procedure Laterality Date  . CESAREAN SECTION    . PATELLA FRACTURE SURGERY Left   . TUBAL LIGATION      reports that she has been smoking Cigars.  She has never used smokeless tobacco. She reports that she does not drink alcohol or use drugs. family history includes Arthritis in her mother; Diabetes in her mother; Heart disease in her father and maternal grandmother; Hyperlipidemia in her mother; Hypertension in her mother. Allergies  Allergen Reactions  . Sulfa Antibiotics Hives  . Tetracyclines & Related Other (See Comments)    sunlight      Outpatient Encounter Prescriptions as of 03/27/2017  Medication Sig  . Diclofenac Sodium 2 % SOLN Apply 1 pump onto the skin twice daily.  . diphenhydramine-acetaminophen (TYLENOL PM) 25-500  MG TABS Take 1-2 tablets by mouth at bedtime as needed (sleep).  . meloxicam (MOBIC) 15 MG tablet Take 15 mg by mouth daily.  . [DISCONTINUED] hydrocortisone (ANUSOL-HC) 25 MG suppository Place 1 suppository (25 mg total) rectally 2 (two) times daily. (Patient not taking: Reported on 03/27/2017)   No facility-administered encounter medications on file as of 03/27/2017.      REVIEW OF SYSTEMS  : All other systems reviewed and negative except where noted in the History of Present Illness.   PHYSICAL EXAM: BP 120/78 (BP Location: Left Arm, Patient Position: Sitting, Cuff Size: Normal)   Pulse 76   Ht 5' 4.25" (1.632 m) Comment: height measured without shoes  Wt 216 lb 4 oz (98.1 kg)   BMI 36.83 kg/m  General: Well developed black female in no acute distress Head: Normocephalic and atraumatic Eyes:  Sclerae anicteric, conjunctiva pink. Ears: Normal auditory acuity Lungs: Clear throughout to auscultation; no increased WOB. Heart: Regular rate and rhythm Abdomen: Soft, non-distended.  Normal bowel sounds.  Non-tender. Rectal:  External hemorrhoid noted on the left with area of excoriation, recent bleeding. Musculoskeletal: Symmetrical with no gross deformities  Skin: No lesions on visible extremities Extremities: No edema  Neurological: Alert oriented x 4, grossly non-focal Psychological:  Alert and cooperative. Normal mood and affect  ASSESSMENT AND PLAN: -Rectal bleeding:  Suspect due to the external hemorrhoid that was seen on exam today.  Will use  hydrocortisone cream at bedtime for 10 days.   -Screening colonoscopy:  Never had one int he past.  Will schedule with Dr. Silverio Decamp.  *The risks, benefits, and alternatives to colonoscopy were discussed with the patient and she consents to proceed.   CC:  Golden Circle, FNP

## 2017-03-27 NOTE — Patient Instructions (Addendum)
Use Hydrocortisone cream at bedtime for 10 days. You have been scheduled for a colonoscopy. Please follow written instructions given to you at your visit today.  Please pick up your prep supplies at the pharmacy within the next 1-3 days. [facility], [address] [employer].  If you use inhalers (even only as needed), please bring them with you on the day of your procedure. Your physician has requested that you go to www.startemmi.com and enter the access code given to you at your visit today. This web site gives a general overview about your procedure. However, you should still follow specific instructions given to you by our office regarding your preparation for the procedure.

## 2017-03-28 NOTE — Progress Notes (Signed)
Reviewed and agree with documentation and assessment and plan. K. Veena Nandigam , MD   

## 2017-04-02 ENCOUNTER — Telehealth: Payer: Self-pay | Admitting: Gastroenterology

## 2017-04-02 NOTE — Telephone Encounter (Signed)
Ok no charge

## 2017-04-02 NOTE — Telephone Encounter (Signed)
Patient canceled procedure for Wednesday 04/04/17 due to her daughter starting college on that day so she does not have a care partner. Patient reschedule for 05/09/17.

## 2017-04-04 ENCOUNTER — Encounter: Payer: BC Managed Care – PPO | Admitting: Gastroenterology

## 2017-04-18 ENCOUNTER — Ambulatory Visit: Payer: Self-pay | Admitting: Obstetrics & Gynecology

## 2017-05-09 ENCOUNTER — Encounter: Payer: BC Managed Care – PPO | Admitting: Gastroenterology

## 2017-05-09 ENCOUNTER — Telehealth: Payer: Self-pay | Admitting: Gastroenterology

## 2017-05-09 NOTE — Telephone Encounter (Signed)
ok 

## 2017-06-20 ENCOUNTER — Ambulatory Visit (AMBULATORY_SURGERY_CENTER): Payer: Self-pay | Admitting: *Deleted

## 2017-06-20 ENCOUNTER — Encounter: Payer: Self-pay | Admitting: Gastroenterology

## 2017-06-20 VITALS — Ht 65.0 in | Wt 215.0 lb

## 2017-06-20 DIAGNOSIS — Z1211 Encounter for screening for malignant neoplasm of colon: Secondary | ICD-10-CM

## 2017-06-20 MED ORDER — NA SULFATE-K SULFATE-MG SULF 17.5-3.13-1.6 GM/177ML PO SOLN: 1.0000 | Freq: Once | ORAL | 0 refills | Status: AC

## 2017-06-20 NOTE — Progress Notes (Signed)
No egg or soy allergy known to patient  No issues with past sedation with any surgeries  or procedures, no intubation problems  No diet pills per patient No home 02 use per patient  No blood thinners per patient  Pt denies issues with constipation now- past hx of constipation  No A fib or A flutter  EMMI video sent to pt's e mail  Pt has bcbs and with her insurance prep is 47$- pt states she truly cannot afford that so gave sample   Medication Samples have been provided to the patient.  Drug name: suprep            Qty: 1  LOT: [id number]  Exp.Date: 9-20  Dosing instructions: as directed   The patient has been instructed regarding the correct time, dose, and frequency of taking this medication, including desired effects and most common side effects.   Toni Kennedy 2:39 PM 06/20/2017

## 2017-06-28 ENCOUNTER — Other Ambulatory Visit: Payer: Self-pay

## 2017-06-28 ENCOUNTER — Encounter: Payer: Self-pay | Admitting: Gastroenterology

## 2017-06-28 ENCOUNTER — Ambulatory Visit (AMBULATORY_SURGERY_CENTER): Payer: BC Managed Care – PPO | Admitting: Gastroenterology

## 2017-06-28 VITALS — BP 121/83 | HR 57 | Temp 97.3°F | Resp 13 | Ht 65.0 in | Wt 215.0 lb

## 2017-06-28 DIAGNOSIS — D120 Benign neoplasm of cecum: Secondary | ICD-10-CM | POA: Diagnosis not present

## 2017-06-28 DIAGNOSIS — Z1212 Encounter for screening for malignant neoplasm of rectum: Secondary | ICD-10-CM | POA: Diagnosis not present

## 2017-06-28 DIAGNOSIS — D123 Benign neoplasm of transverse colon: Secondary | ICD-10-CM | POA: Diagnosis not present

## 2017-06-28 DIAGNOSIS — Z1211 Encounter for screening for malignant neoplasm of colon: Secondary | ICD-10-CM | POA: Diagnosis present

## 2017-06-28 DIAGNOSIS — K621 Rectal polyp: Secondary | ICD-10-CM

## 2017-06-28 DIAGNOSIS — D128 Benign neoplasm of rectum: Secondary | ICD-10-CM

## 2017-06-28 MED ORDER — SODIUM CHLORIDE 0.9 % IV SOLN: 500.0000 mL | INTRAVENOUS | Status: DC

## 2017-06-28 NOTE — Patient Instructions (Addendum)
YOU HAD AN ENDOSCOPIC PROCEDURE TODAY AT [facility]:   Refer to the procedure report that was given to you for any specific questions about what was found during the examination.  If the procedure report does not answer your questions, please call your gastroenterologist to clarify.  If you requested that your care partner not be given the details of your procedure findings, then the procedure report has been included in a sealed envelope for you to review at your convenience later.  YOU SHOULD EXPECT: Some feelings of bloating in the abdomen. Passage of more gas than usual.  Walking can help get rid of the air that was put into your GI tract during the procedure and reduce the bloating. If you had a lower endoscopy (such as a colonoscopy or flexible sigmoidoscopy) you may notice spotting of blood in your stool or on the toilet paper. If you underwent a bowel prep for your procedure, you may not have a normal bowel movement for a few days.  Please Note:  You might notice some irritation and congestion in your nose or some drainage.  This is from the oxygen used during your procedure.  There is no need for concern and it should clear up in a day or so.  SYMPTOMS TO REPORT IMMEDIATELY:   Following lower endoscopy (colonoscopy or flexible sigmoidoscopy):  Excessive amounts of blood in the stool  Significant tenderness or worsening of abdominal pains  Swelling of the abdomen that is new, acute  Fever of 100F or higher   For urgent or emergent issues, a gastroenterologist can be reached at any hour by calling [phone].   DIET:  We do recommend a small meal at first, but then you may proceed to your regular diet.  Drink plenty of fluids but you should avoid alcoholic beverages for 24 hours.  MEDICATIONS: Continue present medications.  FOLLOW UP: Return to GI clinic Dec. 7th at 3:15pm for hemorrhoid banding procedure.  Please see handouts given to you by your recovery  nurse.  ACTIVITY:  You should plan to take it easy for the rest of today and you should NOT DRIVE or use heavy machinery until tomorrow (because of the sedation medicines used during the test).    FOLLOW UP: Our staff will call the number listed on your records the next business day following your procedure to check on you and address any questions or concerns that you may have regarding the information given to you following your procedure. If we do not reach you, we will leave a message.  However, if you are feeling well and you are not experiencing any problems, there is no need to return our call.  We will assume that you have returned to your regular daily activities without incident.  If any biopsies were taken you will be contacted by phone or by letter within the next 1-3 weeks.  Please call us at [phone] if you have not heard about the biopsies in 3 weeks.   Thank you for allowing [place] to provide for your healthcare needs today.  SIGNATURES/CONFIDENTIALITY: You and/or your care partner have signed paperwork which will be entered into your electronic medical record.  These signatures attest to the fact that that the information above on your After Visit Summary has been reviewed and is understood.  Full responsibility of the confidentiality of this discharge information lies with you and/or your care-partner.

## 2017-06-28 NOTE — Progress Notes (Signed)
Pt's states no medical or surgical changes since previsit or office visit. 

## 2017-06-28 NOTE — Progress Notes (Signed)
Called to room to assist during endoscopic procedure.  Patient ID and intended procedure confirmed with present staff. Received instructions for my participation in the procedure from the performing physician.  

## 2017-06-28 NOTE — Op Note (Signed)
[name] Procedure Date: 06/28/2017 2:09 PM MRN: [id number] Endoscopist: Mauri Pole , MD Age: 51 Referring MD:  Date of Birth: [birth year] Gender: Female Account #: [id number] Procedure:                Colonoscopy Indications:              Screening for colorectal malignant neoplasm Medicines:                Monitored Anesthesia Care Procedure:                Pre-Anesthesia Assessment:                           - Prior to the procedure, a History and Physical                            was performed, and patient medications and                            allergies were reviewed. The patient's tolerance of                            previous anesthesia was also reviewed. The risks                            and benefits of the procedure and the sedation                            options and risks were discussed with the patient.                            All questions were answered, and informed consent                            was obtained. Prior Anticoagulants: The patient has                            taken no previous anticoagulant or antiplatelet                            agents. ASA Grade Assessment: I - A normal, healthy                            patient. After reviewing the risks and benefits,                            the patient was deemed in satisfactory condition to                            undergo the procedure.                           After obtaining informed consent, the colonoscope  was passed under direct vision. Throughout the                            procedure, the patient's blood pressure, pulse, and                            oxygen saturations were monitored continuously. The                            Colonoscope was introduced through the anus and                            advanced to the the terminal ileum, with                            identification of the appendiceal  orifice and IC                            valve. The colonoscopy was performed without                            difficulty. The patient tolerated the procedure                            well. The quality of the bowel preparation was                            good. The terminal ileum, ileocecal valve,                            appendiceal orifice, and rectum were photographed. Scope In: 2:18:57 PM Scope Out: 2:30:30 PM Scope Withdrawal Time: 0 hours 10 minutes 31 seconds  Total Procedure Duration: 0 hours 11 minutes 33 seconds  Findings:                 The perianal and digital rectal examinations were                            normal.                           A 12 mm polyp was found in the cecum                            periappendicial orifice. The polyp was sessile. The                            polyp was removed with a piecemeal technique using                            a cold snare. Resection and retrieval were complete.                           Three sessile polyps were found in the rectum and  transverse colon. The polyps were 1 to 2 mm in                            size. These polyps were removed with a cold biopsy                            forceps. Resection and retrieval were complete.                           Scattered small and large-mouthed diverticula were                            found in the sigmoid colon, descending colon,                            transverse colon and ascending colon.                           Non-bleeding external and internal hemorrhoids were                            found during retroflexion. The hemorrhoids were                            large. Complications:            No immediate complications. Estimated Blood Loss:     Estimated blood loss was minimal. Impression:               - One 12 mm polyp in the cecum, removed piecemeal                            using a cold snare. Resected and retrieved.                            - Three 1 to 2 mm polyps in the rectum and in the                            transverse colon, removed with a cold biopsy                            forceps. Resected and retrieved.                           - Diverticulosis in the sigmoid colon, in the                            descending colon, in the transverse colon and in                            the ascending colon.                           - Non-bleeding external and internal hemorrhoids. Recommendation:           -  Patient has a contact number available for                            emergencies. The signs and symptoms of potential                            delayed complications were discussed with the                            patient. Return to normal activities tomorrow.                            Written discharge instructions were provided to the                            patient.                           - Resume previous diet.                           - Continue present medications.                           - Await pathology results.                           - Repeat colonoscopy in 3 - 5 years for                            surveillance based on pathology results.                           - Return to GI clinic at the next available                            appointment. Mauri Pole, MD 06/28/2017 2:37:17 PM This report has been signed electronically.

## 2017-06-28 NOTE — Progress Notes (Signed)
Report given to PACU, vss 

## 2017-06-29 ENCOUNTER — Telehealth: Payer: Self-pay | Admitting: *Deleted

## 2017-06-29 NOTE — Telephone Encounter (Signed)
  Follow up Call-  Call back number 06/28/2017  Post procedure Call Back phone  # [phone]  Permission to leave phone message Yes  Some recent data might be hidden     Patient questions:  Do you have a fever, pain , or abdominal swelling? No. Pain Score  0 *  Have you tolerated food without any problems? Yes.    Have you been able to return to your normal activities? Yes.    Do you have any questions about your discharge instructions: Diet   No. Medications  No. Follow up visit  No.  Do you have questions or concerns about your Care? No.  Actions: * If pain score is 4 or above: No action needed, pain <4.

## 2017-07-03 ENCOUNTER — Encounter: Payer: Self-pay | Admitting: Gastroenterology

## 2017-07-10 ENCOUNTER — Telehealth: Payer: Self-pay | Admitting: Gastroenterology

## 2017-07-10 NOTE — Telephone Encounter (Signed)
Discuss the reason and purpose of rescreening for colon cancer based on the pathology report. Discribed what is done in a "banding".

## 2017-07-27 ENCOUNTER — Encounter: Payer: BC Managed Care – PPO | Admitting: Gastroenterology

## 2017-09-20 ENCOUNTER — Ambulatory Visit (HOSPITAL_COMMUNITY)
Admission: EM | Admit: 2017-09-20 | Discharge: 2017-09-20 | Disposition: A | Discharge status: Home / Self Care | Payer: BC Managed Care – PPO | Attending: Family Medicine | Admitting: Family Medicine

## 2017-09-20 ENCOUNTER — Encounter (HOSPITAL_COMMUNITY): Payer: Self-pay | Admitting: Family Medicine

## 2017-09-20 DIAGNOSIS — J069 Acute upper respiratory infection, unspecified: Secondary | ICD-10-CM | POA: Diagnosis not present

## 2017-09-20 MED ORDER — IBUPROFEN 800 MG PO TABS
ORAL_TABLET | ORAL | Status: AC
  Filled 2017-09-20: qty 1

## 2017-09-20 MED ORDER — CETIRIZINE HCL 10 MG PO TABS: 10.0000 mg | ORAL_TABLET | Freq: Every day | ORAL | 0 refills | Status: DC

## 2017-09-20 MED ORDER — IBUPROFEN 800 MG PO TABS
800.0000 mg | ORAL_TABLET | Freq: Once | ORAL | Status: AC
  Administered 2017-09-20: 800 mg via ORAL

## 2017-09-20 MED ORDER — GUAIFENESIN ER 600 MG PO TB12: 600.0000 mg | ORAL_TABLET | Freq: Two times a day (BID) | ORAL | 0 refills | Status: DC

## 2017-09-20 MED ORDER — FLUTICASONE PROPIONATE 50 MCG/ACT NA SUSP: 1.0000 | Freq: Every day | NASAL | 2 refills | Status: DC

## 2017-09-20 NOTE — Discharge Instructions (Signed)
Push fluids to ensure adequate hydration and keep secretions thin. Tylenol and/or ibuprofen as needed for pain or fevers.  Continue with treatments for symptoms such as daily zyrtec, daily flonase, mucinex as an expectorant. Neti pot or sinus rinse to promote drainage. If symptoms worsen or do not improve in the next week to return to be seen or to follow up with your PCP.

## 2017-09-20 NOTE — ED Triage Notes (Signed)
Pt here for 2 days of cough, fever, chills and body aches. Denies any vomiting or diarrhea. sts loss of appetite. Taking tylenol pm

## 2017-09-20 NOTE — ED Provider Notes (Signed)
[name]    CSN: [id number] Arrival date & time: 09/20/17  1216     History   Chief Complaint Chief Complaint  Patient presents with  . Cough  . Fever  . Chills    HPI Toni Kennedy is a 52 y.o. female.   Toni Kennedy presents with complaints of productive cough, chills/fevers, headache, congestion, runny nose which started two days ago. Worse at night. Without sore throat or ear pain, Toni Kennedy gi/gu complaints. No known ill contacts. Shortness of breath due to congestion, with activity. Took tylenol at 1030 which did help. Has been taking robitussin, theraflu which have helped a small amount. Has not taken any ibuprofen. Did not get a flu vaccine this season. without history of asthma. Occasionally smokes. History of allergies and was recommended to take daily treatment, does not do this.    ROS per HPI.       Past Medical History:  Diagnosis Date  . Allergy   . Anemia   . Anxiety   . Arthritis   . Back complaints   . Blood in stool   . Cardiac arrhythmia   . Depression   . Heart murmur   . Hemorrhoid   . Hypotension    past hx   . Scoliosis     Patient Active Problem List   Diagnosis Date Noted  . Encounter for screening colonoscopy 03/27/2017  . Rectal bleeding 03/27/2017  . External hemorrhoid 03/27/2017  . Routine adult health maintenance 03/14/2017  . Obesity 03/14/2017  . 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 Tibialis posterior tendinitis 02/16/2015    Past Surgical History:  Procedure Laterality Date  . CESAREAN SECTION    . PATELLA FRACTURE SURGERY Left   . TUBAL LIGATION      OB History    No data available       Home Medications    Prior to Admission medications   Medication Sig Start Date End Date Taking? Authorizing Provider  cetirizine (ZYRTEC) 10 MG tablet Take 1 tablet (10 mg total) by mouth daily. 09/20/17   Zigmund Gottron, NP  diphenhydramine-acetaminophen (TYLENOL PM) 25-500 MG TABS Take 1-2  tablets by mouth at bedtime as needed (sleep).    [provider]  fluticasone (FLONASE) 50 MCG/ACT nasal spray Place 1 spray into both nostrils daily. 09/20/17   Zigmund Gottron, NP  guaiFENesin (MUCINEX) 600 MG 12 hr tablet Take 1 tablet (600 mg total) by mouth 2 (two) times daily. 09/20/17   Zigmund Gottron, NP    Family History Family History  Problem Relation Age of Onset  . Arthritis Mother   . Hyperlipidemia Mother   . Hypertension Mother   . Diabetes Mother   . Heart disease Father   . Heart disease Maternal Grandmother   . Colon cancer Neg Hx   . Colon polyps Neg Hx   . Esophageal cancer Neg Hx   . Rectal cancer Neg Hx   . Stomach cancer Neg Hx     Social History Social History   Tobacco Use  . Smoking status: Current Some Day Smoker    Types: Cigars  . Smokeless tobacco: Never Used  Substance Use Topics  . Alcohol use: No  . Drug use: No     Allergies   Sulfa antibiotics and Tetracyclines & related   Review of Systems Review of Systems   Physical Exam Triage Vital Signs ED Triage Vitals  Enc Vitals Group     BP  09/20/17 1259 110/78     Pulse Rate 09/20/17 1259 72     Resp 09/20/17 1259 18     Temp 09/20/17 1259 99.8 F (37.7 C)     Temp src --      SpO2 09/20/17 1259 97 %     Weight --      Height --      Head Circumference --      Peak Flow --      Pain Score 09/20/17 1257 6     Pain Loc --      Pain Edu? --      Excl. in [place]? --    No data found.  Updated Vital Signs BP 110/78   Pulse 72   Temp 99.8 F (37.7 C)   Resp 18   SpO2 97%   Visual Acuity Right Eye Distance:   Left Eye Distance:   Bilateral Distance:    Right Eye Near:   Left Eye Near:    Bilateral Near:     Physical Exam  Constitutional: She is oriented to person, place, and time. She appears well-developed and well-nourished. No distress.  HENT:  Head: Normocephalic and atraumatic.  Right Ear: Tympanic membrane, external ear and ear canal normal.    Left Ear: Tympanic membrane, external ear and ear canal normal.  Nose: Right sinus exhibits frontal sinus tenderness. Left sinus exhibits frontal sinus tenderness.  Mouth/Throat: Uvula is midline, oropharynx is clear and moist and mucous membranes are normal. No tonsillar exudate.  Eyes: Conjunctivae and EOM are normal. Pupils are equal, round, and reactive to light.  Cardiovascular: Normal rate, regular rhythm and normal heart sounds.  Pulmonary/Chest: Effort normal and breath sounds normal.  Abdominal: Soft.  Neurological: She is alert and oriented to person, place, and time.  Skin: Skin is warm and dry.     UC Treatments / Results  Labs (all labs ordered are listed, but only abnormal results are displayed) Labs Reviewed - No data to display  EKG  EKG Interpretation None       Radiology No results found.  Procedures Procedures (including critical care time)  Medications Ordered in UC Medications  ibuprofen (ADVIL,MOTRIN) tablet 800 mg (not administered)     Initial Impression / Assessment and Plan / UC Course  I have reviewed the triage vital signs and the nursing notes.  Pertinent labs & imaging results that were available during my care of the patient were reviewed by me and considered in my medical decision making (see chart for details).     Benign physical findings. Non toxic in appearance. History and physical consistent with viral illness.  Supportive cares recommended. If symptoms worsen or do not improve in the next week to return to be seen or to follow up with PCP.  Patient verbalized understanding and agreeable to plan.    Final Clinical Impressions(s) / UC Diagnoses   Final diagnoses:  Viral upper respiratory tract infection    ED Discharge Orders        Ordered    cetirizine (ZYRTEC) 10 MG tablet  Daily     09/20/17 1323    fluticasone (FLONASE) 50 MCG/ACT nasal spray  Daily     09/20/17 1323    guaiFENesin (MUCINEX) 600 MG 12 hr tablet  2  times daily     09/20/17 1323       Controlled Substance Prescriptions [place] Controlled Substance Registry consulted? Not Applicable   Zigmund Gottron, NP 09/20/17 1331

## 2017-09-21 ENCOUNTER — Other Ambulatory Visit: Payer: Self-pay

## 2017-09-21 ENCOUNTER — Encounter (HOSPITAL_COMMUNITY): Payer: Self-pay | Admitting: *Deleted

## 2017-09-21 ENCOUNTER — Emergency Department (HOSPITAL_COMMUNITY): Payer: BC Managed Care – PPO

## 2017-09-21 ENCOUNTER — Emergency Department (HOSPITAL_COMMUNITY)
Admission: EM | Admit: 2017-09-21 | Discharge: 2017-09-21 | Disposition: A | Discharge status: Home / Self Care | Payer: BC Managed Care – PPO | Attending: Emergency Medicine | Admitting: Emergency Medicine

## 2017-09-21 DIAGNOSIS — R69 Illness, unspecified: Secondary | ICD-10-CM

## 2017-09-21 DIAGNOSIS — Z79899 Other long term (current) drug therapy: Secondary | ICD-10-CM | POA: Insufficient documentation

## 2017-09-21 DIAGNOSIS — R55 Syncope and collapse: Secondary | ICD-10-CM | POA: Diagnosis not present

## 2017-09-21 DIAGNOSIS — J111 Influenza due to unidentified influenza virus with other respiratory manifestations: Secondary | ICD-10-CM | POA: Diagnosis not present

## 2017-09-21 DIAGNOSIS — F17210 Nicotine dependence, cigarettes, uncomplicated: Secondary | ICD-10-CM | POA: Insufficient documentation

## 2017-09-21 LAB — BASIC METABOLIC PANEL
ANION GAP: 10 (ref 5–15)
BUN: 8 mg/dL (ref 6–20)
CHLORIDE: 105 mmol/L (ref 101–111)
CO2: 24 mmol/L (ref 22–32)
CREATININE: 0.8 mg/dL (ref 0.44–1.00)
Calcium: 8.7 mg/dL — ABNORMAL LOW (ref 8.9–10.3)
GFR calc non Af Amer: >60 mL/min (ref >60)
Glucose, Bld: 130 mg/dL — ABNORMAL HIGH (ref 65–99)
POTASSIUM: 3.8 mmol/L (ref 3.5–5.1)
SODIUM: 139 mmol/L (ref 135–145)

## 2017-09-21 LAB — CBC
HCT: 41.6 % (ref 36.0–46.0)
HEMOGLOBIN: 13.2 g/dL (ref 12.0–15.0)
MCH: 27.5 pg (ref 26.0–34.0)
MCHC: 31.7 g/dL (ref 30.0–36.0)
MCV: 86.7 fL (ref 78.0–100.0)
Platelets: 184 10*3/uL (ref 150–400)
RBC: 4.8 MIL/uL (ref 3.87–5.11)
RDW: 14.4 % (ref 11.5–15.5)
WBC: 3.6 10*3/uL — AB (ref 4.0–10.5)

## 2017-09-21 LAB — URINALYSIS, ROUTINE W REFLEX MICROSCOPIC
BILIRUBIN URINE: NEGATIVE
Glucose, UA: NEGATIVE mg/dL
KETONES UR: NEGATIVE mg/dL
Nitrite: NEGATIVE
PH: 5 (ref 5.0–8.0)
Protein, ur: NEGATIVE mg/dL
SPECIFIC GRAVITY, URINE: 1.027 (ref 1.005–1.030)

## 2017-09-21 MED ORDER — KETOROLAC TROMETHAMINE 30 MG/ML IJ SOLN
30.0000 mg | Freq: Once | INTRAMUSCULAR | Status: AC
  Administered 2017-09-21: 30 mg via INTRAVENOUS
  Filled 2017-09-21: qty 1

## 2017-09-21 MED ORDER — SODIUM CHLORIDE 0.9 % IV BOLUS (SEPSIS)
1000.0000 mL | Freq: Once | INTRAVENOUS | Status: AC
  Administered 2017-09-21: 1000 mL via INTRAVENOUS

## 2017-09-21 NOTE — ED Notes (Signed)
ED Provider at bedside. 

## 2017-09-21 NOTE — ED Notes (Signed)
Pt reports having a syncopal episode at work 2 times after feeling dizzy. Pt reports eating breakfast not eating lunch. Pt states she came around and called her daughter to bring her to the hospital. Pt states she felt dizzy in the lobby but feels fine right now.

## 2017-09-21 NOTE — ED Triage Notes (Signed)
Pt c/o dizziness today while at work, pt seen at [facility] yesterday with dx of URI infection with rx for Zyrtec, pt told to get reevaluated if the symptoms got worse, pt productive cough with yellow sputum, denies SOB, A&O x4

## 2017-09-21 NOTE — ED Provider Notes (Signed)
[place] EMERGENCY DEPARTMENT Provider Note   CSN: [id number] Arrival date & time: 09/21/17  1413     History   Chief Complaint Chief Complaint  Patient presents with  . Near Syncope    HPI Toni Kennedy is a 52 y.o. female.  Patient is a 52 year old female with a history of depression, anemia and prior hypertension who presents with a near syncopal episode.  She states that she has had cough and cold-like symptoms for the last 3 days.  She had some chills and myalgias.  She is been feeling well fatigue.  She has had nausea but no vomiting.  No diarrhea.  Positive subjective fevers.  She states that today she was working and had 2 episodes where she felt lightheaded and felt like she might pass out.  She was assisted to a chair by a Mud[employer].  She did not fall.  She had no chest pain or palpitations.  No shortness of breath.  No other recent illnesses.  No history of similar symptoms in the past.      Past Medical History:  Diagnosis Date  . Allergy   . Anemia   . Anxiety   . Arthritis   . Back complaints   . Blood in stool   . Cardiac arrhythmia   . Depression   . Heart murmur   . Hemorrhoid   . Hypotension    past hx   . Scoliosis     Patient Active Problem List   Diagnosis Date Noted  . Encounter for screening colonoscopy 03/27/2017  . Rectal bleeding 03/27/2017  . External hemorrhoid 03/27/2017  . Routine adult health maintenance 03/14/2017  . Obesity 03/14/2017  . 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 Tibialis posterior tendinitis 02/16/2015    Past Surgical History:  Procedure Laterality Date  . CESAREAN SECTION    . PATELLA FRACTURE SURGERY Left   . TUBAL LIGATION      OB History    No data available       Home Medications    Prior to Admission medications   Medication Sig Start Date End Date Taking? Authorizing Provider  cetirizine (ZYRTEC) 10 MG tablet Take 1 tablet (10 mg total) by mouth daily.  09/20/17   Zigmund Gottron, NP  diphenhydramine-acetaminophen (TYLENOL PM) 25-500 MG TABS Take 1-2 tablets by mouth at bedtime as needed (sleep).    [provider]  fluticasone (FLONASE) 50 MCG/ACT nasal spray Place 1 spray into both nostrils daily. 09/20/17   Zigmund Gottron, NP  guaiFENesin (MUCINEX) 600 MG 12 hr tablet Take 1 tablet (600 mg total) by mouth 2 (two) times daily. 09/20/17   Zigmund Gottron, NP    Family History Family History  Problem Relation Age of Onset  . Arthritis Mother   . Hyperlipidemia Mother   . Hypertension Mother   . Diabetes Mother   . Heart disease Father   . Heart disease Maternal Grandmother   . Colon cancer Neg Hx   . Colon polyps Neg Hx   . Esophageal cancer Neg Hx   . Rectal cancer Neg Hx   . Stomach cancer Neg Hx     Social History Social History   Tobacco Use  . Smoking status: Current Some Day Smoker    Types: Cigars  . Smokeless tobacco: Never Used  Substance Use Topics  . Alcohol use: No  . Drug use: No     Allergies   Sulfa antibiotics and  Tetracyclines & related   Review of Systems Review of Systems  Constitutional: Positive for chills, fatigue and fever. Negative for diaphoresis.  HENT: Positive for congestion and rhinorrhea. Negative for sneezing.   Eyes: Negative.   Respiratory: Positive for cough. Negative for chest tightness and shortness of breath.   Cardiovascular: Negative for chest pain and leg swelling.  Gastrointestinal: Positive for nausea. Negative for abdominal pain, blood in stool, diarrhea and vomiting.  Genitourinary: Negative for difficulty urinating, flank pain, frequency and hematuria.  Musculoskeletal: Positive for myalgias. Negative for arthralgias and back pain.  Skin: Negative for rash.  Neurological: Positive for light-headedness. Negative for dizziness, speech difficulty, weakness, numbness and headaches.     Physical Exam Updated Vital Signs BP 124/81   Pulse 72   Temp 99.1 F  (37.3 C) (Oral)   Resp 16   Ht 5\' 5"  (1.651 m)   Wt 96.2 kg (212 lb)   SpO2 100%   BMI 35.28 kg/m   Physical Exam  Constitutional: She is oriented to person, place, and time. She appears well-developed and well-nourished.  HENT:  Head: Normocephalic and atraumatic.  Right Ear: External ear normal.  Left Ear: External ear normal.  Mouth/Throat: Oropharynx is clear and moist. No oropharyngeal exudate.  Eyes: Pupils are equal, round, and reactive to light.  Neck: Normal range of motion. Neck supple.  Cardiovascular: Normal rate, regular rhythm and normal heart sounds.  Pulmonary/Chest: Effort normal and breath sounds normal. No respiratory distress. She has no wheezes. She has no rales. She exhibits no tenderness.  Abdominal: Soft. Bowel sounds are normal. There is no tenderness. There is no rebound and no guarding.  Musculoskeletal: Normal range of motion. She exhibits no edema.  Lymphadenopathy:    She has no cervical adenopathy.  Neurological: She is alert and oriented to person, place, and time. She has normal strength. No cranial nerve deficit or sensory deficit.  Skin: Skin is warm and dry. No rash noted.  Psychiatric: She has a normal mood and affect.     ED Treatments / Results  Labs (all labs ordered are listed, but only abnormal results are displayed) Labs Reviewed  BASIC METABOLIC PANEL - Abnormal; Notable for the following components:      Result Value   Glucose, Bld 130 (*)    Calcium 8.7 (*)    All other components within normal limits  CBC - Abnormal; Notable for the following components:   WBC 3.6 (*)    All other components within normal limits  URINALYSIS, ROUTINE W REFLEX MICROSCOPIC - Abnormal; Notable for the following components:   APPearance HAZY (*)    Hgb urine dipstick MODERATE (*)    Leukocytes, UA TRACE (*)    Bacteria, UA RARE (*)    Squamous Epithelial / LPF 0-5 (*)    All other components within normal limits  CBG MONITORING, ED    EKG   EKG Interpretation  Date/Time:  Friday September 21 2017 14:47:04 EST Ventricular Rate:  66 PR Interval:  122 QRS Duration: 78 QT Interval:  396 QTC Calculation: 415 R Axis:   61 Text Interpretation:  Normal sinus rhythm Nonspecific T wave abnormality Abnormal ECG since last tracing no significant change Confirmed by Malvin Johns [phone]) on 09/21/2017 5:20:55 PM       Radiology Dg Chest 2 View  Result Date: 09/21/2017 CLINICAL DATA:  Productive cough for 5 days.  Near syncope. EXAM: CHEST  2 VIEW COMPARISON:  None. FINDINGS: The heart size and mediastinal  contours are within normal limits. Both lungs are clear. The visualized skeletal structures are unremarkable. IMPRESSION: No active cardiopulmonary disease. Electronically Signed   By: Staci Righter M.D.   On: 09/21/2017 17:54    Procedures Procedures (including critical care time)  Medications Ordered in ED Medications  sodium chloride 0.9 % bolus 1,000 mL (0 mLs Intravenous Stopped 09/21/17 1915)  ketorolac (TORADOL) 30 MG/ML injection 30 mg (30 mg Intravenous Given 09/21/17 1800)     Initial Impression / Assessment and Plan / ED Course  I have reviewed the triage vital signs and the nursing notes.  Pertinent labs & imaging results that were available during my care of the patient were reviewed by me and considered in my medical decision making (see chart for details).     Patient is a 52 year old female with a 3-4-day history of flulike symptoms.  There is no evidence of pneumonia.  She was given IV fluids in the ED and has no more dizziness.  She is able to ambulate without dizziness.  Her vital signs are non-concerning.  Her labs are non-concerning.  I instructed her in symptomatic care.  I feel like her near syncopal episode was likely related to her flulike symptoms/dehydration.  She had no other symptoms that would be more concerning for arrhythmia, ACS or other illnesses.  She was discharged home in good condition.  Return  precautions were given.  Final Clinical Impressions(s) / ED Diagnoses   Final diagnoses:  Near syncope  Influenza-like illness    ED Discharge Orders    None       Malvin Johns, MD 09/21/17 1946

## 2017-09-21 NOTE — ED Notes (Signed)
Pt ambulated well on the unit no complaints noted at this time

## 2017-09-26 ENCOUNTER — Ambulatory Visit: Payer: BC Managed Care – PPO | Admitting: Family

## 2017-10-01 ENCOUNTER — Encounter: Payer: Self-pay | Admitting: Family

## 2017-10-01 ENCOUNTER — Ambulatory Visit (INDEPENDENT_AMBULATORY_CARE_PROVIDER_SITE_OTHER): Payer: BC Managed Care – PPO | Admitting: Family

## 2017-10-01 ENCOUNTER — Other Ambulatory Visit (INDEPENDENT_AMBULATORY_CARE_PROVIDER_SITE_OTHER): Payer: BC Managed Care – PPO

## 2017-10-01 VITALS — BP 122/80 | HR 62 | Temp 98.5°F | Ht 65.0 in | Wt 219.1 lb

## 2017-10-01 DIAGNOSIS — M545 Low back pain: Secondary | ICD-10-CM

## 2017-10-01 DIAGNOSIS — G8929 Other chronic pain: Secondary | ICD-10-CM

## 2017-10-01 DIAGNOSIS — R739 Hyperglycemia, unspecified: Secondary | ICD-10-CM

## 2017-10-01 LAB — BASIC METABOLIC PANEL
BUN: 12 mg/dL (ref 6–23)
CALCIUM: 9.1 mg/dL (ref 8.4–10.5)
CO2: 29 mEq/L (ref 19–32)
CREATININE: 0.71 mg/dL (ref 0.40–1.20)
Chloride: 106 mEq/L (ref 96–112)
GFR: 111.73 mL/min (ref >60.00)
GLUCOSE: 94 mg/dL (ref 70–99)
Potassium: 4 mEq/L (ref 3.5–5.1)
Sodium: 142 mEq/L (ref 135–145)

## 2017-10-01 LAB — HEMOGLOBIN A1C: Hgb A1c MFr Bld: 6.5 % (ref 4.6–6.5)
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
## 2017-10-03 ENCOUNTER — Telehealth: Payer: Self-pay

## 2017-10-03 ENCOUNTER — Other Ambulatory Visit: Payer: Self-pay | Admitting: Family

## 2017-10-03 DIAGNOSIS — E119 Type 2 diabetes mellitus without complications: Secondary | ICD-10-CM

## 2017-10-03 NOTE — Telephone Encounter (Signed)
Copied from [place] [id number]. Topic: Quick Communication - Lab Results >> Oct 03, 2017 10:06 AM Cecelia Byars, NT wrote: Patient called and would like recent lab results please call her at [phone]

## 2017-10-04 ENCOUNTER — Telehealth: Payer: Self-pay

## 2017-10-04 NOTE — Telephone Encounter (Signed)
I spoke with patient on yesterday regarding results. She was also set up on My-chart so she will be able to access all of her medical notes as needed.  Copied from [place] [id number]. Topic: Quick Communication - Lab Results >> Oct 03, 2017 10:06 AM Cecelia Byars, NT wrote: Patient called and would like recent lab results please call her at [phone]

## 2017-10-11 ENCOUNTER — Encounter: Payer: BC Managed Care – PPO | Attending: Family | Admitting: Dietician

## 2017-10-11 ENCOUNTER — Encounter: Payer: Self-pay | Admitting: Dietician

## 2017-10-11 DIAGNOSIS — E6609 Other obesity due to excess calories: Secondary | ICD-10-CM

## 2017-10-11 DIAGNOSIS — Z6834 Body mass index (BMI) 34.0-34.9, adult: Secondary | ICD-10-CM

## 2017-10-11 DIAGNOSIS — E119 Type 2 diabetes mellitus without complications: Secondary | ICD-10-CM | POA: Diagnosis not present

## 2017-10-11 DIAGNOSIS — Z713 Dietary counseling and surveillance: Secondary | ICD-10-CM | POA: Insufficient documentation

## 2017-10-11 NOTE — Patient Instructions (Addendum)
Rethink what you drink.  Avoid drinking carbohydrates.  Drink more water. Be more active.  Aim for 30 minutes most days.  (swimming, walking, armchair exercises). Regular meal schedule:  Avoid skipping meals. Consider getting your vitamin D checked or take 1000-2000 units daily. Call your insurance to find the preferred meter.  Then call MD for a prescription for strips and lancing devises. Call you MD about your feet Eat more non starchy vegetable.  Mindfulness  Plan:  Aim for 2-3 Carb Choices per meal (30-45 grams) +/- 1 either way  Aim for 0-1 Carbs per snack if hungry  Include protein in moderation with your meals and snacks Consider reading food labels for Total Carbohydrate and Fat Grams of foods Consider checking BG at alternate times per day as directed by MD   Calorie [employer] app
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
## 2017-10-25 ENCOUNTER — Encounter: Payer: Self-pay | Admitting: Physical Medicine & Rehabilitation

## 2017-11-02 ENCOUNTER — Encounter: Payer: Self-pay | Admitting: Family

## 2017-11-05 ENCOUNTER — Encounter (HOSPITAL_COMMUNITY): Payer: Self-pay | Admitting: Family Medicine

## 2017-11-05 ENCOUNTER — Ambulatory Visit (HOSPITAL_COMMUNITY)
Admission: EM | Admit: 2017-11-05 | Discharge: 2017-11-05 | Disposition: A | Discharge status: Home / Self Care | Payer: BC Managed Care – PPO | Attending: Urgent Care | Admitting: Urgent Care

## 2017-11-05 DIAGNOSIS — J3489 Other specified disorders of nose and nasal sinuses: Secondary | ICD-10-CM

## 2017-11-05 DIAGNOSIS — J3089 Other allergic rhinitis: Secondary | ICD-10-CM | POA: Diagnosis not present

## 2017-11-05 MED ORDER — METHYLPREDNISOLONE ACETATE 80 MG/ML IJ SUSP
INTRAMUSCULAR | Status: AC
  Filled 2017-11-05: qty 1

## 2017-11-05 MED ORDER — PSEUDOEPHEDRINE HCL 60 MG PO TABS: 60.0000 mg | ORAL_TABLET | Freq: Three times a day (TID) | ORAL | 0 refills | Status: DC | PRN

## 2017-11-05 MED ORDER — AMOXICILLIN 500 MG PO CAPS: 500.0000 mg | ORAL_CAPSULE | Freq: Three times a day (TID) | ORAL | 0 refills | Status: DC

## 2017-11-05 MED ORDER — MONTELUKAST SODIUM 10 MG PO TABS: 10.0000 mg | ORAL_TABLET | Freq: Every day | ORAL | 3 refills | Status: DC

## 2017-11-05 MED ORDER — METHYLPREDNISOLONE ACETATE 80 MG/ML IJ SUSP
80.0000 mg | Freq: Once | INTRAMUSCULAR | Status: AC
  Administered 2017-11-05: 80 mg via INTRAMUSCULAR

## 2017-11-05 NOTE — ED Triage Notes (Signed)
Pt here for sinus congestion. Reports that she has been taking flonase and zyrtec without relief. Reports that she took 3 abx that her friend gave her and the infection got better but has returned. Her symptoms this time have been x 1 week and worse.

## 2017-11-05 NOTE — ED Provider Notes (Signed)
  MRN: [id number] DOB: [birth year]  Subjective:   Toni Kennedy is a 52 y.o. female presenting for 1 week history of sinus pain, sinus congestion, bilateral ear fullness, sore throat, productive cough. Has tried Zyrtec, Flonase. Denies fever, chest pain, shob, n/v, abdominal pain, rashes. Denies smoking cigarettes. Denies history of asthma. Of note, patient reports that she has been told she has chronic rhinitis. Her daughter does as well. Daughter was treated with antibiotics ~1 week ago and patient took several doses of this as well with immediate relief and then symptoms returned.   No current facility-administered medications for this encounter.   Current Outpatient Medications:  .  cetirizine (ZYRTEC) 10 MG tablet, Take 1 tablet (10 mg total) by mouth daily., Disp: 30 tablet, Rfl: 0 .  diphenhydramine-acetaminophen (TYLENOL PM) 25-500 MG TABS, Take 1-2 tablets by mouth at bedtime as needed (sleep)., Disp: , Rfl:  .  fluticasone (FLONASE) 50 MCG/ACT nasal spray, Place 1 spray into both nostrils daily., Disp: 16 g, Rfl: 2 .  guaiFENesin (MUCINEX) 600 MG 12 hr tablet, Take 1 tablet (600 mg total) by mouth 2 (two) times daily., Disp: 20 tablet, Rfl: 0   Toni Kennedy is allergic to sulfa antibiotics and tetracyclines & related.  Toni Kennedy  has a past medical history of Allergy, Anemia, Anxiety, Arthritis, Back complaints, Blood in stool, Cardiac arrhythmia, Depression, Heart murmur, Hemorrhoid, Hypotension, and Scoliosis. Also  has a past surgical history that includes Cesarean section; Tubal ligation; and Patella fracture surgery (Left).  Objective:   Vitals: BP 115/70   Pulse 68   Temp 98.6 F (37 C)   Resp 18   SpO2 98%   Physical Exam  Constitutional: She is oriented to person, place, and time. She appears well-developed and well-nourished.  HENT:  TM's opaque bilaterally but no effusions or erythema. Nasal turbinates boggy and edematous, nasal passages minimally patent, without sinus  tenderness.  Postnasal drip present in cobblestone pattern but without oropharyngeal exudates, erythema or abscesses.   Eyes: Right eye exhibits no discharge. Left eye exhibits no discharge. No scleral icterus.  Neck: Normal range of motion. Neck supple.  Cardiovascular: Normal rate, regular rhythm and intact distal pulses. Exam reveals no gallop and no friction rub.  No murmur heard. Pulmonary/Chest: No respiratory distress. She has no wheezes. She has no rales.  Lymphadenopathy:    She has no cervical adenopathy.  Neurological: She is alert and oriented to person, place, and time.  Skin: Skin is warm and dry.  Psychiatric: She has a normal mood and affect.   Assessment and Plan :   Allergic rhinitis due to other allergic trigger, unspecified seasonality  Sinus pain  Will use IM Depomedrol today, hold off on Flonase. Use Zyrtec daily with Sudafed as needed. Provided patient with script for amoxicillin that she can fill in 1 week if her symptoms persist. Counseled against using antibiotics not prescribed to patient and discussed possible adverse reactions with use of antibiotics such as C. diff. Return-to-clinic precautions discussed, patient verbalized understanding.    Jaynee Eagles, [place] 11/05/17 [id number]

## 2017-11-05 NOTE — Discharge Instructions (Addendum)
Hydrate well with at least 2 liters (1 gallon) of water daily. Hold off on Flonase until you have at least 1 week of no symptoms. Use Sudafed (pseudoephedrine) for sinus congestion.

## 2017-11-06 ENCOUNTER — Telehealth: Payer: Self-pay | Admitting: Nurse Practitioner

## 2017-11-06 NOTE — Telephone Encounter (Signed)
Copied from [place]. Topic: Quick Communication - Rx Refill/Question >> Nov 06, 2017  3:27 PM Burnis Medin, NT wrote: Medication: one touch lancets and needles   Has the patient contacted their pharmacy? Yes   (Agent: If no, request that the patient contact the pharmacy for the refill.)   Preferred Pharmacy (with phone number or street name):  [facility] [place], [place] [phone] (Phone) [phone] (Fax)    Agent: Please be advised that RX refills may take up to 3 business days. We ask that you follow-up with your pharmacy.

## 2017-11-07 MED ORDER — GLUCOSE BLOOD VI STRP: ORAL_STRIP | 0 refills | Status: DC

## 2017-11-07 MED ORDER — ONETOUCH ULTRASOFT LANCETS MISC: 0 refills | Status: DC

## 2017-11-07 NOTE — Telephone Encounter (Signed)
Do not see lancets and needles on pt.'s medication list. Please advise.

## 2017-11-07 NOTE — Telephone Encounter (Signed)
Pt saw Mickel Baas, NP dx w/ diabetes and was sent to nutritionist. She states she was given a BS monitor, but she is needing strips. Inform sent supples to pof.Marland KitchenJohny Chess

## 2017-11-15 ENCOUNTER — Encounter
Payer: BC Managed Care – PPO | Attending: Physical Medicine & Rehabilitation | Admitting: Physical Medicine & Rehabilitation

## 2017-11-15 ENCOUNTER — Encounter: Payer: Self-pay | Admitting: Physical Medicine & Rehabilitation

## 2017-11-15 VITALS — BP 119/81 | HR 51 | Resp 14 | Ht 65.0 in | Wt 215.0 lb

## 2017-11-15 DIAGNOSIS — F419 Anxiety disorder, unspecified: Secondary | ICD-10-CM | POA: Diagnosis not present

## 2017-11-15 DIAGNOSIS — E6609 Other obesity due to excess calories: Secondary | ICD-10-CM

## 2017-11-15 DIAGNOSIS — M545 Low back pain, unspecified: Secondary | ICD-10-CM

## 2017-11-15 DIAGNOSIS — Z9889 Other specified postprocedural states: Secondary | ICD-10-CM | POA: Insufficient documentation

## 2017-11-15 DIAGNOSIS — G479 Sleep disorder, unspecified: Secondary | ICD-10-CM | POA: Diagnosis not present

## 2017-11-15 DIAGNOSIS — M791 Myalgia, unspecified site: Secondary | ICD-10-CM

## 2017-11-15 DIAGNOSIS — F329 Major depressive disorder, single episode, unspecified: Secondary | ICD-10-CM

## 2017-11-15 DIAGNOSIS — Z6835 Body mass index (BMI) 35.0-35.9, adult: Secondary | ICD-10-CM | POA: Diagnosis not present

## 2017-11-15 DIAGNOSIS — M2242 Chondromalacia patellae, left knee: Secondary | ICD-10-CM | POA: Diagnosis not present

## 2017-11-15 DIAGNOSIS — M76821 Posterior tibial tendinitis, right leg: Secondary | ICD-10-CM | POA: Diagnosis not present

## 2017-11-15 DIAGNOSIS — Z9851 Tubal ligation status: Secondary | ICD-10-CM | POA: Diagnosis not present

## 2017-11-15 DIAGNOSIS — F17290 Nicotine dependence, other tobacco product, uncomplicated: Secondary | ICD-10-CM | POA: Diagnosis not present

## 2017-11-15 DIAGNOSIS — M839 Adult osteomalacia, unspecified: Secondary | ICD-10-CM | POA: Diagnosis not present

## 2017-11-15 DIAGNOSIS — G8929 Other chronic pain: Secondary | ICD-10-CM | POA: Diagnosis not present

## 2017-11-15 DIAGNOSIS — M419 Scoliosis, unspecified: Secondary | ICD-10-CM | POA: Insufficient documentation

## 2017-11-15 DIAGNOSIS — M12562 Traumatic arthropathy, left knee: Secondary | ICD-10-CM | POA: Diagnosis not present

## 2017-11-15 DIAGNOSIS — K625 Hemorrhage of anus and rectum: Secondary | ICD-10-CM | POA: Diagnosis not present

## 2017-11-15 DIAGNOSIS — R269 Unspecified abnormalities of gait and mobility: Secondary | ICD-10-CM | POA: Diagnosis not present

## 2017-11-15 MED ORDER — DICLOFENAC SODIUM 1 % TD GEL: 2.0000 g | Freq: Four times a day (QID) | TRANSDERMAL | 1 refills | Status: DC

## 2017-11-15 MED ORDER — METHOCARBAMOL 500 MG PO TABS: 500.0000 mg | ORAL_TABLET | Freq: Three times a day (TID) | ORAL | 1 refills | Status: DC | PRN

## 2017-11-15 MED ORDER — DULOXETINE HCL 30 MG PO CPEP: 30.0000 mg | ORAL_CAPSULE | Freq: Every day | ORAL | 1 refills | Status: DC
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
## 2017-11-16 LAB — MAGNESIUM: Magnesium: 2.1 mg/dL (ref 1.6–2.3)

## 2017-11-16 LAB — VITAMIN D 25 HYDROXY (VIT D DEFICIENCY, FRACTURES): Vit D, 25-Hydroxy: 7.8 ng/mL — ABNORMAL LOW (ref 30.0–100.0)

## 2017-11-22 ENCOUNTER — Encounter: Payer: BC Managed Care – PPO | Attending: Family | Admitting: Dietician

## 2017-11-22 ENCOUNTER — Encounter (HOSPITAL_COMMUNITY): Payer: Self-pay | Admitting: Emergency Medicine

## 2017-11-22 ENCOUNTER — Ambulatory Visit (INDEPENDENT_AMBULATORY_CARE_PROVIDER_SITE_OTHER): Payer: BC Managed Care – PPO

## 2017-11-22 ENCOUNTER — Ambulatory Visit (HOSPITAL_COMMUNITY)
Admission: EM | Admit: 2017-11-22 | Discharge: 2017-11-22 | Disposition: A | Discharge status: Home / Self Care | Payer: BC Managed Care – PPO | Attending: Family Medicine | Admitting: Family Medicine

## 2017-11-22 ENCOUNTER — Other Ambulatory Visit: Payer: Self-pay

## 2017-11-22 DIAGNOSIS — Z6835 Body mass index (BMI) 35.0-35.9, adult: Secondary | ICD-10-CM

## 2017-11-22 DIAGNOSIS — M79671 Pain in right foot: Secondary | ICD-10-CM

## 2017-11-22 DIAGNOSIS — E119 Type 2 diabetes mellitus without complications: Secondary | ICD-10-CM

## 2017-11-22 DIAGNOSIS — Z713 Dietary counseling and surveillance: Secondary | ICD-10-CM | POA: Diagnosis present

## 2017-11-22 DIAGNOSIS — E6609 Other obesity due to excess calories: Secondary | ICD-10-CM

## 2017-11-22 DIAGNOSIS — E559 Vitamin D deficiency, unspecified: Secondary | ICD-10-CM

## 2017-11-22 MED ORDER — PREDNISONE 50 MG PO TABS: 50.0000 mg | ORAL_TABLET | Freq: Every day | ORAL | 0 refills | Status: AC

## 2017-11-22 NOTE — Discharge Instructions (Signed)
Ice, elevation, supportive shoes- avoid walking barefoot 5 days of prednisone. May start 500mg  naproxen twice a day after prednisone completed. Activity as tolerated. Please follow up with your orthopedist for recheck as may need injections if persistent symptoms.

## 2017-11-22 NOTE — ED Provider Notes (Signed)
[place]    CSN: [id number] Arrival date & time: 11/22/17  1015     History   Chief Complaint Chief Complaint  Patient presents with  . Foot Pain    HPI Toni Kennedy is a 52 y.o. female.   Toni Kennedy presents with complaints of right foot pain which started yesterday and is worse with movement and with weight bearing. States she has had in the past and was told her "arch fell" and received steroid injections. Has not had one in 2 years. No specific known new injury. Without history of gout. No fevers. Rates pain 9/10. Took tylenol which did not help. Causes pain to radiate up leg. History of arthritis, anxiety, scoliosis, back pain.    ROS per HPI.      Past Medical History:  Diagnosis Date  . Allergy   . Anemia   . Anxiety   . Arthritis   . Back complaints   . Blood in stool   . Cardiac arrhythmia   . Depression   . Heart murmur   . Hemorrhoid   . Hypotension    past hx   . Scoliosis     Patient Active Problem List   Diagnosis Date Noted  . Chronic bilateral low back pain without sciatica 11/15/2017  . Encounter for screening colonoscopy 03/27/2017  . Rectal bleeding 03/27/2017  . External hemorrhoid 03/27/2017  . Routine adult health maintenance 03/14/2017  . Obesity 03/14/2017  . 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 Tibialis posterior tendinitis 02/16/2015    Past Surgical History:  Procedure Laterality Date  . CESAREAN SECTION    . PATELLA FRACTURE SURGERY Left   . TUBAL LIGATION      OB History   None      Home Medications    Prior to Admission medications   Medication Sig Start Date End Date Taking? Authorizing Provider  diclofenac sodium (VOLTAREN) 1 % GEL Apply 2 g topically 4 (four) times daily. 11/15/17   Jamse Arn, MD  diphenhydramine-acetaminophen (TYLENOL PM) 25-500 MG TABS Take 1-2 tablets by mouth at bedtime as needed (sleep).    [provider]  predniSONE (DELTASONE) 50 MG  tablet Take 1 tablet (50 mg total) by mouth daily with breakfast for 5 days. 11/22/17 11/27/17  Zigmund Gottron, NP    Family History Family History  Problem Relation Age of Onset  . Arthritis Mother   . Hyperlipidemia Mother   . Hypertension Mother   . Diabetes Mother   . Heart disease Father   . Heart disease Maternal Grandmother   . Colon cancer Neg Hx   . Colon polyps Neg Hx   . Esophageal cancer Neg Hx   . Rectal cancer Neg Hx   . Stomach cancer Neg Hx     Social History Social History   Tobacco Use  . Smoking status: Current Some Day Smoker    Types: Cigars  . Smokeless tobacco: Never Used  Substance Use Topics  . Alcohol use: No  . Drug use: No     Allergies   Sulfa antibiotics and Tetracyclines & related   Review of Systems Review of Systems   Physical Exam Triage Vital Signs ED Triage Vitals  Enc Vitals Group     BP 11/22/17 1040 123/85     Pulse Rate 11/22/17 1040 67     Resp 11/22/17 1040 18     Temp 11/22/17 1040 98.3 F (36.8 C)     Temp  Source 11/22/17 1040 Oral     SpO2 11/22/17 1040 96 %     Weight --      Height --      Head Circumference --      Peak Flow --      Pain Score 11/22/17 1035 9     Pain Loc --      Pain Edu? --      Excl. in [place]? --    No data found.  Updated Vital Signs BP 123/85 (BP Location: Right Arm)   Pulse 67   Temp 98.3 F (36.8 C) (Oral)   Resp 18   SpO2 96%   Visual Acuity Right Eye Distance:   Left Eye Distance:   Bilateral Distance:    Right Eye Near:   Left Eye Near:    Bilateral Near:     Physical Exam  Constitutional: She is oriented to person, place, and time. She appears well-developed and well-nourished. No distress.  Cardiovascular: Normal rate, regular rhythm and normal heart sounds.  Pulmonary/Chest: Effort normal and breath sounds normal.  Musculoskeletal:       Right foot: There is tenderness and bony tenderness. There is normal range of motion, no swelling, normal capillary refill, no  crepitus, no deformity and no laceration.       Feet:  Tenderness and pain at medial midfoot; without noted redness or swelling; full ROM but pain with eversion and inversion; strong pedal dorsalis pulse; sensation intact; moving toes without difficulty; cap refill < 2 seconds   Neurological: She is alert and oriented to person, place, and time.  Skin: Skin is warm and dry.     UC Treatments / Results  Labs (all labs ordered are listed, but only abnormal results are displayed) Labs Reviewed - No data to display  EKG None Radiology Dg Foot Complete Right  Result Date: 11/22/2017 CLINICAL DATA:  Acute right foot pain without known injury. EXAM: RIGHT FOOT COMPLETE - 3+ VIEW COMPARISON:  Radiographs of September 12, 2014. FINDINGS: There is no evidence of fracture or dislocation. There is no evidence of arthropathy or other focal bone abnormality. Soft tissues are unremarkable. IMPRESSION: No significant abnormality seen in the right foot. Electronically Signed   By: Marijo Conception, M.D.   On: 11/22/2017 11:22    Procedures Procedures (including critical care time)  Medications Ordered in UC Medications - No data to display   Initial Impression / Assessment and Plan / UC Course  I have reviewed the triage vital signs and the nursing notes.  Pertinent labs & imaging results that were available during my care of the patient were reviewed by me and considered in my medical decision making (see chart for details).     Without indications of infectious process. Without swelling, redness or warmth. Negative xray today. Ice, elevation, supportive shoes, 5 days of prednisone, naproxen PRN. Follow up with orthopedics for injections as needed. Patient verbalized understanding and agreeable to plan.  Ambulatory out of clinic without difficulty.    Final Clinical Impressions(s) / UC Diagnoses   Final diagnoses:  Foot pain, right    ED Discharge Orders        Ordered    predniSONE  (DELTASONE) 50 MG tablet  Daily with breakfast     11/22/17 1135       Controlled Substance Prescriptions [place] Controlled Substance Registry consulted? Not Applicable   Zigmund Gottron, NP 11/22/17 1139

## 2017-11-22 NOTE — ED Triage Notes (Addendum)
patient reports a history of "arch falling".  Patient says her foot started hurting yesterday.  Sharp pain in right foot.  No known injury.  Patient pointing to inside ankle as location of pain

## 2017-11-22 NOTE — Patient Instructions (Addendum)
Start 5,000 units of vitamin d-3 daily.  Discuss the deficiency with your doctor. Find ways to be active (yoga, arm chair exercise- bands or floor exercises or weights or dancing, moving, swimming)  Little bits add up.  Consider googling arm chair exercises  Consider pool class (aquatic center or [facility]) Find ways to increase your fruit and vegetables.  Try these rather than chips for a snack. Continue to stop when you are satisfied. Great job on the changes that you have made such as avoiding most soda, baking more, avoiding gravy, and stopping when you are satisfied!

## 2017-11-23 ENCOUNTER — Encounter: Payer: Self-pay | Admitting: Dietician
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
## 2017-12-04 ENCOUNTER — Encounter: Payer: BC Managed Care – PPO | Attending: Physical Medicine & Rehabilitation | Admitting: Psychology

## 2017-12-04 DIAGNOSIS — M545 Low back pain: Secondary | ICD-10-CM | POA: Insufficient documentation

## 2017-12-04 DIAGNOSIS — R269 Unspecified abnormalities of gait and mobility: Secondary | ICD-10-CM | POA: Insufficient documentation

## 2017-12-04 DIAGNOSIS — F4310 Post-traumatic stress disorder, unspecified: Secondary | ICD-10-CM | POA: Diagnosis not present

## 2017-12-04 DIAGNOSIS — Z6835 Body mass index (BMI) 35.0-35.9, adult: Secondary | ICD-10-CM | POA: Insufficient documentation

## 2017-12-04 DIAGNOSIS — M419 Scoliosis, unspecified: Secondary | ICD-10-CM | POA: Diagnosis not present

## 2017-12-04 DIAGNOSIS — F419 Anxiety disorder, unspecified: Secondary | ICD-10-CM | POA: Insufficient documentation

## 2017-12-04 DIAGNOSIS — F17290 Nicotine dependence, other tobacco product, uncomplicated: Secondary | ICD-10-CM | POA: Diagnosis not present

## 2017-12-04 DIAGNOSIS — M12562 Traumatic arthropathy, left knee: Secondary | ICD-10-CM | POA: Diagnosis not present

## 2017-12-04 DIAGNOSIS — F329 Major depressive disorder, single episode, unspecified: Secondary | ICD-10-CM | POA: Diagnosis not present

## 2017-12-04 DIAGNOSIS — Z9889 Other specified postprocedural states: Secondary | ICD-10-CM | POA: Diagnosis not present

## 2017-12-04 DIAGNOSIS — F331 Major depressive disorder, recurrent, moderate: Secondary | ICD-10-CM

## 2017-12-04 DIAGNOSIS — M839 Adult osteomalacia, unspecified: Secondary | ICD-10-CM | POA: Insufficient documentation

## 2017-12-04 DIAGNOSIS — G479 Sleep disorder, unspecified: Secondary | ICD-10-CM | POA: Insufficient documentation

## 2017-12-04 DIAGNOSIS — Z9851 Tubal ligation status: Secondary | ICD-10-CM | POA: Diagnosis not present

## 2017-12-04 DIAGNOSIS — G8929 Other chronic pain: Secondary | ICD-10-CM | POA: Insufficient documentation

## 2017-12-04 DIAGNOSIS — M791 Myalgia, unspecified site: Secondary | ICD-10-CM | POA: Insufficient documentation

## 2017-12-13 ENCOUNTER — Encounter: Payer: BC Managed Care – PPO | Admitting: Physical Medicine & Rehabilitation

## 2017-12-23 ENCOUNTER — Encounter: Payer: Self-pay | Admitting: Psychology
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
## 2017-12-28 ENCOUNTER — Ambulatory Visit: Payer: BC Managed Care – PPO | Admitting: Nurse Practitioner

## 2018-01-15 ENCOUNTER — Other Ambulatory Visit: Payer: Self-pay

## 2018-01-15 ENCOUNTER — Ambulatory Visit (HOSPITAL_COMMUNITY)
Admission: EM | Admit: 2018-01-15 | Discharge: 2018-01-15 | Disposition: A | Discharge status: Home / Self Care | Payer: BC Managed Care – PPO | Attending: Family Medicine | Admitting: Family Medicine

## 2018-01-15 ENCOUNTER — Encounter (HOSPITAL_COMMUNITY): Payer: Self-pay | Admitting: Emergency Medicine

## 2018-01-15 DIAGNOSIS — M79671 Pain in right foot: Secondary | ICD-10-CM

## 2018-01-15 DIAGNOSIS — G8929 Other chronic pain: Secondary | ICD-10-CM | POA: Diagnosis not present

## 2018-01-15 MED ORDER — PREDNISONE 10 MG (21) PO TBPK: ORAL_TABLET | ORAL | 0 refills | Status: DC

## 2018-01-15 NOTE — ED Provider Notes (Signed)
[place]    CSN: [id number] Arrival date & time: 01/15/18  1251     History   Chief Complaint Chief Complaint  Patient presents with  . Foot Pain    HPI Toni Kennedy is a 52 y.o. female.   HPI  Patient is here for chronic right foot and ankle pain.  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  She has recurring posterior tibialis tendinitis.  She was previously under the care of a sports medicine doctor.  She has received multiple injections in this tendon with good results in the past.  She is here today requesting an injection.  She is out of her supportive shoe wear, wearing flip-flops.  She is been told that she needs to lose weight to assist with this process.  Her ankle is very painful causing her to limp and limit her walking.  Past Medical History:  Diagnosis Date  . Allergy   . Anemia   . Anxiety   . Arthritis   . Back complaints   . Blood in stool   . Cardiac arrhythmia   . Depression   . Heart murmur   . Hemorrhoid   . Hypotension    past hx   . Scoliosis     Patient Active Problem List   Diagnosis Date Noted  . Chronic bilateral low back pain without sciatica 11/15/2017  . Encounter for screening colonoscopy 03/27/2017  . Rectal bleeding 03/27/2017  . External hemorrhoid 03/27/2017  . Routine adult health maintenance 03/14/2017  . Obesity 03/14/2017  . 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 Tibialis posterior tendinitis 02/16/2015    Past Surgical History:  Procedure Laterality Date  . CESAREAN SECTION    . PATELLA FRACTURE SURGERY Left   . TUBAL LIGATION      OB History   None      Home Medications    Prior to Admission medications   Medication Sig Start Date End Date Taking? Authorizing Provider  ibuprofen (ADVIL,MOTRIN) 600 MG tablet Take 600 mg by mouth every 6 (six) hours as needed.   Yes [provider]  predniSONE (STERAPRED UNI-PAK 21 TAB) 10 MG (21) TBPK tablet Tad 01/15/18   Raylene Everts, MD    Family History Family History  Problem Relation Age of Onset  . Arthritis Mother   . Hyperlipidemia Mother   . Hypertension Mother   . Diabetes Mother   . Heart disease Father   . Heart disease Maternal Grandmother   . Colon cancer Neg Hx   . Colon polyps Neg Hx   . Esophageal cancer Neg Hx   . Rectal cancer Neg Hx   . Stomach cancer Neg Hx     Social History Social History   Tobacco Use  . Smoking status: Current Some Day Smoker    Types: Cigars  . Smokeless tobacco: Never Used  Substance Use Topics  . Alcohol use: No  . Drug use: No     Allergies   Sulfa antibiotics and Tetracyclines & related   Review of Systems Review of Systems  Constitutional: Negative for chills and fever.  HENT: Negative for ear pain and sore throat.   Eyes: Negative for pain and visual disturbance.  Respiratory: Negative for cough and shortness of breath.   Cardiovascular: Negative for chest pain and palpitations.  Gastrointestinal: Negative for abdominal pain and vomiting.  Genitourinary: Negative for dysuria and hematuria.  Musculoskeletal: Positive for gait problem. Negative for arthralgias and back pain.  Skin: Negative for color change and rash.  Neurological: Negative for seizures and syncope.  All other systems reviewed and are negative.    Physical Exam Triage Vital Signs ED Triage Vitals  Enc Vitals Group     BP 01/15/18 1306 126/85     Pulse Rate 01/15/18 1306 67     Resp 01/15/18 1306 18     Temp 01/15/18 1306 99 F (37.2 C)     Temp Source 01/15/18 1306 Oral     SpO2 01/15/18 1306 99 %     Weight --      Height --      Head Circumference --      Peak Flow --      Pain Score 01/15/18 1304 8     Pain Loc --      Pain Edu? --      Excl. in [place]? --    No data found.  Updated Vital Signs BP 126/85 (BP Location: Left Arm)   Pulse 67   Temp 99  F (37.2 C) (Oral)   Resp 18   SpO2 99%   Visual Acuity Right Eye Distance:   Left Eye Distance:   Bilateral Distance:    Right Eye Near:   Left Eye Near:    Bilateral Near:     Physical Exam  Constitutional: She appears well-developed and well-nourished. No distress.  HENT:  Head: Normocephalic and atraumatic.  Mouth/Throat: Oropharynx is clear and moist.  Eyes: Pupils are equal, round, and reactive to light. Conjunctivae are normal.  Neck: Normal range of motion.  Cardiovascular: Normal rate.  Pulmonary/Chest: Effort normal. No respiratory distress.  Abdominal: Soft. She exhibits no distension.  Musculoskeletal: Normal range of motion. She exhibits no edema.  Right foot is examined.  Pes planus.  Valgus deformity of the ankle.  Tenderness over the posterior tibialis tendon, pain with ankle range of motion  Neurological: She is alert.  Skin: Skin is warm and dry.     UC Treatments / Results  Labs (all labs ordered are listed, but only abnormal results are displayed) Labs Reviewed - No data to display  EKG None  Radiology No results found.  Procedures Procedures (including critical care time)  Medications Ordered in UC Medications - No data to display  Initial Impression / Assessment and Plan / UC Course  I have reviewed the triage vital signs and the nursing notes.  Pertinent labs & imaging results that were available during my care of the patient were reviewed by me and considered in my medical decision making (see chart for details).     I explained to the patient that we do not do this injection in the urgent care office.  I offered her referrals for an orthopedic urgent care so she could get an injection today.  She declines due to financial limitations.  She is therefore treated conservatively with a prednisone pack.  Strict instructions to use ice, elevation, and limit weightbearing.  Get back into a supportive shoe wear soon as possible.  Follow-up with  her usual provider. Final Clinical Impressions(s) / UC Diagnoses   Final diagnoses:  Chronic foot pain, right     Discharge Instructions     You can try the orthopedic urgent care for injection   ED Prescriptions    Medication Sig Dispense Auth. Provider   predniSONE (STERAPRED UNI-PAK 21 TAB) 10  MG (21) TBPK tablet Tad 21 tablet Raylene Everts, MD     Controlled Substance Prescriptions [place] Controlled Substance Registry consulted? Not Applicable   Raylene Everts, MD 01/15/18 [id number]

## 2018-01-15 NOTE — Discharge Instructions (Signed)
You can try the orthopedic urgent care for injection

## 2018-01-15 NOTE — ED Triage Notes (Signed)
The patient presented to the [facility] with a complaint of right foot pain that has been chronic.

## 2018-01-17 ENCOUNTER — Ambulatory Visit: Payer: BC Managed Care – PPO | Admitting: Dietician

## 2018-01-29 ENCOUNTER — Encounter: Payer: BC Managed Care – PPO | Attending: Physical Medicine & Rehabilitation | Admitting: Psychology

## 2018-01-29 DIAGNOSIS — Z6835 Body mass index (BMI) 35.0-35.9, adult: Secondary | ICD-10-CM | POA: Insufficient documentation

## 2018-01-29 DIAGNOSIS — M839 Adult osteomalacia, unspecified: Secondary | ICD-10-CM | POA: Insufficient documentation

## 2018-01-29 DIAGNOSIS — M419 Scoliosis, unspecified: Secondary | ICD-10-CM | POA: Insufficient documentation

## 2018-01-29 DIAGNOSIS — M12562 Traumatic arthropathy, left knee: Secondary | ICD-10-CM | POA: Insufficient documentation

## 2018-01-29 DIAGNOSIS — R269 Unspecified abnormalities of gait and mobility: Secondary | ICD-10-CM | POA: Insufficient documentation

## 2018-01-29 DIAGNOSIS — F329 Major depressive disorder, single episode, unspecified: Secondary | ICD-10-CM | POA: Insufficient documentation

## 2018-01-29 DIAGNOSIS — G8929 Other chronic pain: Secondary | ICD-10-CM | POA: Insufficient documentation

## 2018-01-29 DIAGNOSIS — G479 Sleep disorder, unspecified: Secondary | ICD-10-CM | POA: Insufficient documentation

## 2018-01-29 DIAGNOSIS — F17290 Nicotine dependence, other tobacco product, uncomplicated: Secondary | ICD-10-CM | POA: Insufficient documentation

## 2018-01-29 DIAGNOSIS — Z9889 Other specified postprocedural states: Secondary | ICD-10-CM | POA: Insufficient documentation

## 2018-01-29 DIAGNOSIS — M791 Myalgia, unspecified site: Secondary | ICD-10-CM | POA: Insufficient documentation

## 2018-01-29 DIAGNOSIS — Z9851 Tubal ligation status: Secondary | ICD-10-CM | POA: Insufficient documentation

## 2018-01-29 DIAGNOSIS — F419 Anxiety disorder, unspecified: Secondary | ICD-10-CM | POA: Insufficient documentation

## 2018-01-29 DIAGNOSIS — M545 Low back pain: Secondary | ICD-10-CM | POA: Insufficient documentation

## 2018-02-14 ENCOUNTER — Ambulatory Visit: Payer: BC Managed Care – PPO | Admitting: Psychology

## 2018-02-28 ENCOUNTER — Encounter: Payer: BC Managed Care – PPO | Attending: Physical Medicine & Rehabilitation | Admitting: Psychology

## 2018-02-28 DIAGNOSIS — F419 Anxiety disorder, unspecified: Secondary | ICD-10-CM | POA: Insufficient documentation

## 2018-02-28 DIAGNOSIS — M419 Scoliosis, unspecified: Secondary | ICD-10-CM | POA: Insufficient documentation

## 2018-02-28 DIAGNOSIS — F17290 Nicotine dependence, other tobacco product, uncomplicated: Secondary | ICD-10-CM | POA: Insufficient documentation

## 2018-02-28 DIAGNOSIS — G8929 Other chronic pain: Secondary | ICD-10-CM | POA: Insufficient documentation

## 2018-02-28 DIAGNOSIS — M839 Adult osteomalacia, unspecified: Secondary | ICD-10-CM | POA: Insufficient documentation

## 2018-02-28 DIAGNOSIS — G479 Sleep disorder, unspecified: Secondary | ICD-10-CM | POA: Insufficient documentation

## 2018-02-28 DIAGNOSIS — M791 Myalgia, unspecified site: Secondary | ICD-10-CM | POA: Insufficient documentation

## 2018-02-28 DIAGNOSIS — M545 Low back pain: Secondary | ICD-10-CM | POA: Insufficient documentation

## 2018-02-28 DIAGNOSIS — Z9889 Other specified postprocedural states: Secondary | ICD-10-CM | POA: Insufficient documentation

## 2018-02-28 DIAGNOSIS — Z6835 Body mass index (BMI) 35.0-35.9, adult: Secondary | ICD-10-CM | POA: Insufficient documentation

## 2018-02-28 DIAGNOSIS — R269 Unspecified abnormalities of gait and mobility: Secondary | ICD-10-CM | POA: Insufficient documentation

## 2018-02-28 DIAGNOSIS — M12562 Traumatic arthropathy, left knee: Secondary | ICD-10-CM | POA: Insufficient documentation

## 2018-02-28 DIAGNOSIS — F329 Major depressive disorder, single episode, unspecified: Secondary | ICD-10-CM | POA: Insufficient documentation

## 2018-02-28 DIAGNOSIS — Z9851 Tubal ligation status: Secondary | ICD-10-CM | POA: Insufficient documentation

## 2018-03-03 NOTE — Progress Notes (Deleted)
[name] [address] [place], [place] [address] Phone: [phone] Subjective:    I'm seeing this patient by the request  of:    CC:   [id number]  Toni Kennedy is a 52 y.o. female coming in with complaint of ***  Onset-  Location Duration-  Character- Aggravating factors- Reliving factors-  Therapies tried-  Severity-     Past Medical History:  Diagnosis Date  . Allergy   . Anemia   . Anxiety   . Arthritis   . Back complaints   . Blood in stool   . Cardiac arrhythmia   . Depression   . Heart murmur   . Hemorrhoid   . Hypotension    past hx   . Scoliosis    Past Surgical History:  Procedure Laterality Date  . CESAREAN SECTION    . PATELLA FRACTURE SURGERY Left   . TUBAL LIGATION     Social History   Socioeconomic History  . Marital status: Divorced    Spouse name: Not on file  . Number of children: 2  . Years of education: 65  . Highest education level: Not on file  Occupational History  . Occupation: Designer, industrial/product  Social Needs  . Financial resource strain: Not on file  . Food insecurity:    Worry: Not on file    Inability: Not on file  . Transportation needs:    Medical: Not on file    Non-medical: Not on file  Tobacco Use  . Smoking status: Current Some Day Smoker    Types: Cigars  . Smokeless tobacco: Never Used  Substance and Sexual Activity  . Alcohol use: No  . Drug use: No  . Sexual activity: Not on file  Lifestyle  . Physical activity:    Days per week: Not on file    Minutes per session: Not on file  . Stress: Not on file  Relationships  . Social connections:    Talks on phone: Not on file    Gets together: Not on file    Attends religious service: Not on file    Active member of club or organization: Not on file    Attends meetings of clubs or organizations: Not on file    Relationship status: Not on file  Other Topics Concern  . Not on file  Social History Narrative   Fun/Hobby: Play sweepstakes   Denies abuse and feels safe at home.    Allergies  Allergen Reactions  . Sulfa Antibiotics Hives  . Tetracyclines & Related Other (See Comments)    sunlight   Family History  Problem Relation Age of Onset  . Arthritis Mother   . Hyperlipidemia Mother   . Hypertension Mother   . Diabetes Mother   . Heart disease Father   . Heart disease Maternal Grandmother   . Colon cancer Neg Hx   . Colon polyps Neg Hx   . Esophageal cancer Neg Hx   . Rectal cancer Neg Hx   . Stomach cancer Neg Hx      Past medical history, social, surgical and family history all reviewed in electronic medical record.  No pertanent information unless stated regarding to the chief complaint.   Review of Systems:Review of systems updated and as accurate as of 03/03/18  No headache, visual changes, nausea, vomiting, diarrhea, constipation, dizziness, abdominal pain, skin rash, fevers, chills, night sweats, weight loss, swollen lymph nodes, body aches, joint swelling, muscle aches, chest pain,  shortness of breath, mood changes.   Objective  There were no vitals taken for this visit. Systems examined below as of 03/03/18   General: No apparent distress alert and oriented x3 mood and affect normal, dressed appropriately.  HEENT: Pupils equal, extraocular movements intact  Respiratory: Patient's speak in full sentences and does not appear short of breath  Cardiovascular: No lower extremity edema, non tender, no erythema  Skin: Warm dry intact with no signs of infection or rash on extremities or on axial skeleton.  Abdomen: Soft nontender  Neuro: Cranial nerves II through XII are intact, neurovascularly intact in all extremities with 2+ DTRs and 2+ pulses.  Lymph: No lymphadenopathy of posterior or anterior cervical chain or axillae bilaterally.  Gait normal with good balance and coordination.  MSK:  Non tender with full range of motion and good stability and symmetric strength and  tone of shoulders, elbows, wrist, hip, knee and ankles bilaterally.     Impression and Recommendations:     This case required medical decision making of moderate complexity.      Note: This dictation was prepared with Dragon dictation along with smaller phrase technology. Any transcriptional errors that result from this process are unintentional.

## 2018-03-04 ENCOUNTER — Encounter

## 2018-03-04 ENCOUNTER — Ambulatory Visit: Payer: BC Managed Care – PPO | Admitting: Family Medicine

## 2018-03-04 DIAGNOSIS — Z0289 Encounter for other administrative examinations: Secondary | ICD-10-CM

## 2018-03-06 NOTE — Progress Notes (Signed)
[name] [address], [place] [address] Phone: [phone] Subjective:     CC: Right ankle pain  [id number]  Rajni D Fusilier is a 52 y.o. female coming in with complaint of right foot pain. She is having pain on the medial aspect of her ankle. Has had injection that has helped. Constant pain. Nothing improves her pain.  Patient has been in the emergency room twice.  X-rays of the foot were reviewed by me showing no significant bony abnormality.  Patient has seen me greater than 2 years ago for more of a posterior tibialis tendon insufficiency but did respond very well to injection.  States though that now this pain is severe enough that is stopping her from daily activities.       Past Medical History:  Diagnosis Date  . Allergy   . Anemia   . Anxiety   . Arthritis   . Back complaints   . Blood in stool   . Cardiac arrhythmia   . Depression   . Heart murmur   . Hemorrhoid   . Hypotension    past hx   . Scoliosis    Past Surgical History:  Procedure Laterality Date  . CESAREAN SECTION    . PATELLA FRACTURE SURGERY Left   . TUBAL LIGATION     Social History   Socioeconomic History  . Marital status: Divorced    Spouse name: Not on file  . Number of children: 2  . Years of education: 23  . Highest education level: Not on file  Occupational History  . Occupation: Designer, industrial/product  Social Needs  . Financial resource strain: Not on file  . Food insecurity:    Worry: Not on file    Inability: Not on file  . Transportation needs:    Medical: Not on file    Non-medical: Not on file  Tobacco Use  . Smoking status: Current Some Day Smoker    Types: Cigars  . Smokeless tobacco: Never Used  Substance and Sexual Activity  . Alcohol use: No  . Drug use: No  . Sexual activity: Not on file  Lifestyle  . Physical activity:    Days per week: Not on file    Minutes per session: Not on file  . Stress: Not on file    Relationships  . Social connections:    Talks on phone: Not on file    Gets together: Not on file    Attends religious service: Not on file    Active member of club or organization: Not on file    Attends meetings of clubs or organizations: Not on file    Relationship status: Not on file  Other Topics Concern  . Not on file  Social History Narrative   Fun/Hobby: Play sweepstakes   Denies abuse and feels safe at home.    Allergies  Allergen Reactions  . Sulfa Antibiotics Hives  . Tetracyclines & Related Other (See Comments)    sunlight   Family History  Problem Relation Age of Onset  . Arthritis Mother   . Hyperlipidemia Mother   . Hypertension Mother   . Diabetes Mother   . Heart disease Father   . Heart disease Maternal Grandmother   . Colon cancer Neg Hx   . Colon polyps Neg Hx   . Esophageal cancer Neg Hx   . Rectal cancer Neg Hx   . Stomach cancer Neg Hx      Past  medical history, social, surgical and family history all reviewed in electronic medical record.  No pertanent information unless stated regarding to the chief complaint.   Review of Systems:Review of systems updated and as accurate as of 03/08/18  No headache, visual changes, nausea, vomiting, diarrhea, constipation, dizziness, abdominal pain, skin rash, fevers, chills, night sweats, weight loss, swollen lymph nodes, body aches, joint swelling, , chest pain, shortness of breath, mood changes.  Positive muscle aches  Objective  Blood pressure 116/76, pulse 76, height 5\' 5"  (1.651 m), weight 213 lb (96.6 kg), SpO2 98 %. Systems examined below as of 03/08/18   General: No apparent distress alert and oriented x3 mood and affect normal, dressed appropriately.  HEENT: Pupils equal, extraocular movements intact  Respiratory: Patient's speak in full sentences and does not appear short of breath  Cardiovascular: No lower extremity edema, non tender, no erythema  Skin: Warm dry intact with no signs of infection  or rash on extremities or on axial skeleton.  Abdomen: Soft nontender  Neuro: Cranial nerves II through XII are intact, neurovascularly intact in all extremities with 2+ DTRs and 2+ pulses.  Lymph: No lymphadenopathy of posterior or anterior cervical chain or axillae bilaterally.  Gait normal with good balance and coordination.  MSK:  Non tender with full range of motion and good stability and symmetric strength and tone of shoulders, elbows, wrist, hip, knee bilaterally.  Ankle: Right Swelling noted over the medial aspect of the ankle.  Significant breakdown of the longitudinal arch.  Patient does have insufficiency of the posterior tibialis tendinitis Strength is 4/5 in all directions.  Compared to the contralateral side Stable lateral and medial ligaments; squeeze test and kleiger test unremarkable; Talar dome nontender; No pain at base of 5th MT; No tenderness over cuboid; No tenderness over N spot or navicular prominence No tenderness on posterior aspects of lateral and medial malleolus No sign of peroneal tendon subluxations or tenderness to palpation Negative tarsal tunnel tinel's Able to walk 4 steps. Contralateral ankle unremarkable  Procedure: Real-time Ultrasound Guided Injection of right ankle Device: GE Logiq Q7 Ultrasound guided injection is preferred based studies that show increased duration, increased effect, greater accuracy, decreased procedural pain, increased response rate, and decreased cost with ultrasound guided versus blind injection.  Verbal informed consent obtained.  Time-out conducted.  Noted no overlying erythema, induration, or other signs of local infection.  Skin prepped in a sterile fashion.  Local anesthesia: Topical Ethyl chloride.  With sterile technique and under real time ultrasound guidance: With a 25-gauge 1 inch needle patient was injected with a total of 0.5 cc Completed without difficulty  Pain immediately resolved suggesting accurate placement  of the medication.  Advised to call if fevers/chills, erythema, induration, drainage, or persistent bleeding.  Images permanently stored and available for review in the ultrasound unit.  Impression: Technically successful ultrasound guided injection.   Impression and Recommendations:     This case required medical decision making of moderate complexity.      Note: This dictation was prepared with Dragon dictation along with smaller phrase technology. Any transcriptional errors that result from this process are unintentional.

## 2018-03-08 ENCOUNTER — Ambulatory Visit (INDEPENDENT_AMBULATORY_CARE_PROVIDER_SITE_OTHER): Payer: BC Managed Care – PPO | Admitting: Family Medicine

## 2018-03-08 ENCOUNTER — Ambulatory Visit: Payer: Self-pay

## 2018-03-08 ENCOUNTER — Encounter: Payer: Self-pay | Admitting: Family Medicine

## 2018-03-08 VITALS — BP 116/76 | HR 76 | Ht 65.0 in | Wt 213.0 lb

## 2018-03-08 DIAGNOSIS — M79671 Pain in right foot: Secondary | ICD-10-CM

## 2018-03-08 DIAGNOSIS — M76821 Posterior tibial tendinitis, right leg: Secondary | ICD-10-CM

## 2018-03-08 NOTE — Patient Instructions (Signed)
Good to see you  Ice is your friend Ice 20 minutes 2 times daily. Usually after activity and before bed. Exercises 3 times a week.  Stay active.  Heel lift in the shoe Good shoes only  See me again in 3-4 weeks if not perfect

## 2018-03-08 NOTE — Assessment & Plan Note (Signed)
Patient given injection.  Discussed icing regimen at home exercises.  Discussed topical anti-inflammatories. Patient is to consider possible custom orthotics.  Encouraged her to do the home exercise more regular basis.  Discussed weight loss and a heel lift.  Follow-up again in 4 weeks

## 2018-03-27 ENCOUNTER — Encounter: Payer: BC Managed Care – PPO | Admitting: Nurse Practitioner

## 2018-03-27 DIAGNOSIS — Z0289 Encounter for other administrative examinations: Secondary | ICD-10-CM

## 2019-01-25 IMAGING — DX DG FOOT COMPLETE 3+V*R*
3 series · 3 of 3 positions shown · non-contrast
Comparison: Radiographs September 12, 2014.

CLINICAL DATA: Acute right foot pain without known injury.

EXAM:
RIGHT FOOT COMPLETE - 3+ VIEW

[foot ap]
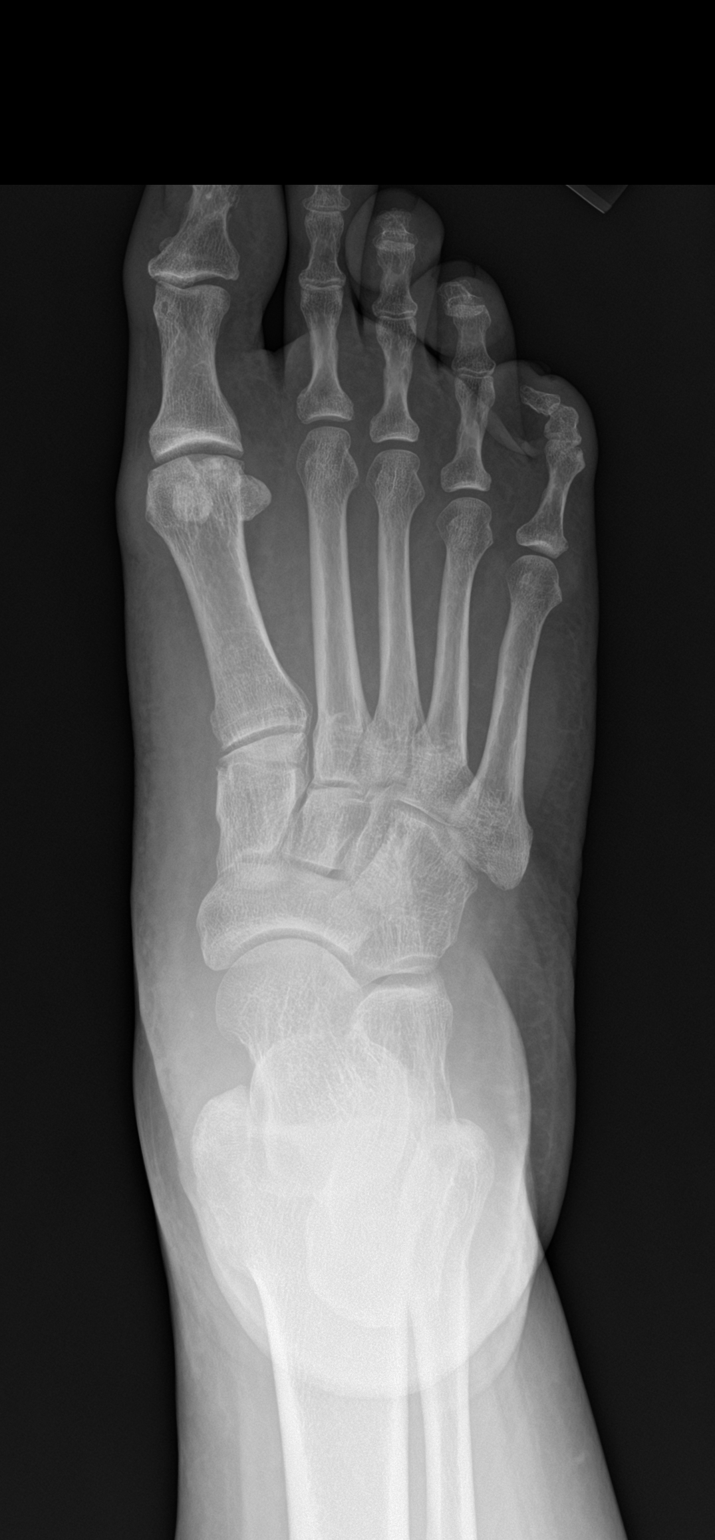

[foot obl]
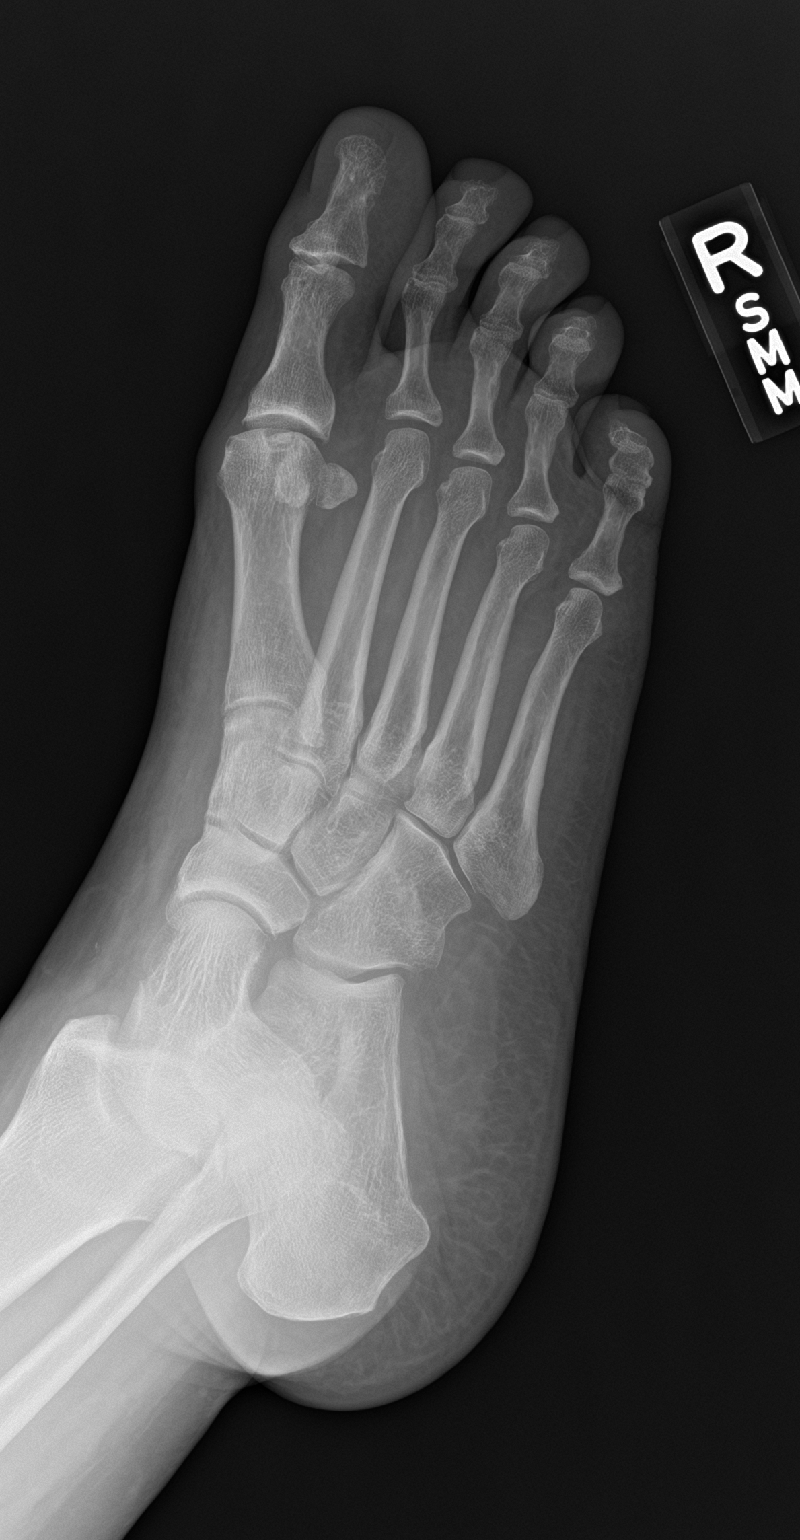

[foot lat]
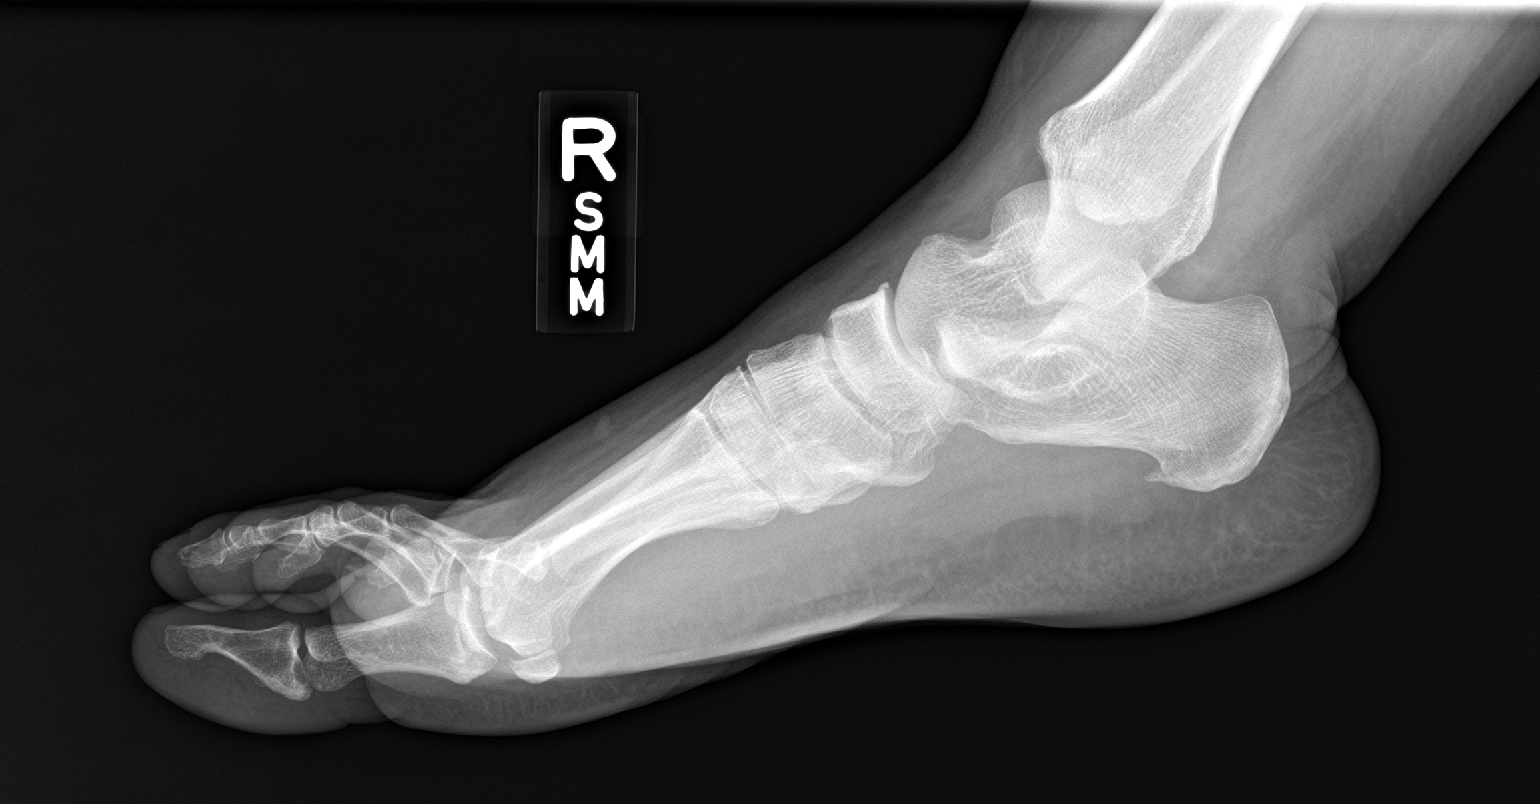

[3 of 3 positions shown; findings below may reference images not displayed]

FINDINGS: There is no evidence of fracture or dislocation. There is no
evidence of arthropathy or other focal bone abnormality. Soft
tissues are unremarkable.
IMPRESSION: No significant abnormality seen in the right foot.

## 2019-03-05 ENCOUNTER — Other Ambulatory Visit: Payer: Self-pay | Admitting: Internal Medicine

## 2019-03-05 DIAGNOSIS — Z20822 Contact with and (suspected) exposure to covid-19: Secondary | ICD-10-CM

## 2019-03-09 LAB — NOVEL CORONAVIRUS, NAA: SARS-CoV-2, NAA: NOT DETECTED

## 2019-04-17 ENCOUNTER — Encounter (HOSPITAL_COMMUNITY): Payer: Self-pay | Admitting: Urgent Care

## 2019-04-17 ENCOUNTER — Ambulatory Visit (HOSPITAL_COMMUNITY)
Admission: EM | Admit: 2019-04-17 | Discharge: 2019-04-17 | Disposition: A | Discharge status: Home / Self Care | Payer: BC Managed Care – PPO | Attending: Family Medicine | Admitting: Family Medicine

## 2019-04-17 ENCOUNTER — Other Ambulatory Visit: Payer: Self-pay

## 2019-04-17 DIAGNOSIS — R0981 Nasal congestion: Secondary | ICD-10-CM | POA: Insufficient documentation

## 2019-04-17 DIAGNOSIS — R05 Cough: Secondary | ICD-10-CM | POA: Insufficient documentation

## 2019-04-17 DIAGNOSIS — R0789 Other chest pain: Secondary | ICD-10-CM | POA: Diagnosis present

## 2019-04-17 DIAGNOSIS — J329 Chronic sinusitis, unspecified: Secondary | ICD-10-CM | POA: Diagnosis not present

## 2019-04-17 DIAGNOSIS — J3089 Other allergic rhinitis: Secondary | ICD-10-CM | POA: Diagnosis present

## 2019-04-17 DIAGNOSIS — Z20828 Contact with and (suspected) exposure to other viral communicable diseases: Secondary | ICD-10-CM | POA: Diagnosis not present

## 2019-04-17 DIAGNOSIS — R059 Cough, unspecified: Secondary | ICD-10-CM

## 2019-04-17 MED ORDER — PREDNISONE 20 MG PO TABS: ORAL_TABLET | ORAL | 0 refills | Status: DC

## 2019-04-17 MED ORDER — CETIRIZINE HCL 10 MG PO TABS: 10.0000 mg | ORAL_TABLET | Freq: Every day | ORAL | 0 refills | Status: DC

## 2019-04-17 MED ORDER — PROMETHAZINE-DM 6.25-15 MG/5ML PO SYRP: 5.0000 mL | ORAL_SOLUTION | Freq: Every evening | ORAL | 0 refills | Status: DC | PRN

## 2019-04-17 MED ORDER — BENZONATATE 100 MG PO CAPS: 100.0000 mg | ORAL_CAPSULE | Freq: Three times a day (TID) | ORAL | 0 refills | Status: DC | PRN

## 2019-04-17 NOTE — Discharge Instructions (Signed)
We will manage this as a viral syndrome/sinusitis. For sore throat or cough try using a honey-based tea. Use 3 teaspoons of honey with juice squeezed from half lemon. Place shaved pieces of ginger into 1/2-1 cup of water and warm over stove top. Then mix the ingredients and repeat every 4 hours as needed. Please take Tylenol 500mg  every 6 hours. Hydrate very well with at least 2 liters of water. Eat light meals such as soups to replenish electrolytes and soft fruits, veggies. Start an antihistamine like Zyrtec, Allegra or Claritin for postnasal drainage, sinus congestion.

## 2019-04-17 NOTE — ED Provider Notes (Signed)
MRN: [id number] DOB: 06-06-1966  Subjective:   Toni Kennedy is a 53 y.o. female presenting for 2 day history of sinus congestion, post-nasal drainage. Has history of significant allergies. Was previously on medication for this but stopped taking medication on her own. Has had friend test positive for COVID 19 ~1 month ago. She tested negative from this exposure before. Smokes Black & Milds.  Patient has tried over-the-counter phenylephrine and Benadryl without any relief.   Allergies  Allergen Reactions  . Sulfa Antibiotics Hives  . Tetracyclines & Related Other (See Comments)    sunlight    Past Medical History:  Diagnosis Date  . Allergy   . Anemia   . Anxiety   . Arthritis   . Back complaints   . Blood in stool   . Cardiac arrhythmia   . Depression   . Heart murmur   . Hemorrhoid   . Hypotension    past hx   . Scoliosis      Past Surgical History:  Procedure Laterality Date  . CESAREAN SECTION    . PATELLA FRACTURE SURGERY Left   . TUBAL LIGATION      Review of Systems  Constitutional: Negative for fever and malaise/fatigue.  HENT: Positive for congestion. Negative for ear pain, sinus pain and sore throat.        Ear fullness bilaterally.  Eyes: Negative for blurred vision, double vision, discharge and redness.  Respiratory: Positive for cough (dry). Negative for hemoptysis, shortness of breath and wheezing.   Cardiovascular: Positive for chest pain (with coughing only).  Gastrointestinal: Negative for abdominal pain, diarrhea, nausea and vomiting.  Genitourinary: Negative for dysuria, flank pain and hematuria.  Musculoskeletal: Negative for myalgias.  Skin: Negative for rash.  Neurological: Positive for dizziness (from ear fullness) and headaches. Negative for weakness.  Psychiatric/Behavioral: Negative for depression and substance abuse.    Objective:   Vitals: BP (!) 146/92 (BP Location: Right Arm)   Pulse 85   Temp 98.5 F (36.9 C) (Oral)   Resp  16   SpO2 96%   Physical Exam Constitutional:      General: She is not in acute distress.    Appearance: Normal appearance. She is well-developed. She is not ill-appearing, toxic-appearing or diaphoretic.  HENT:     Head: Normocephalic and atraumatic.     Comments: No sinus tenderness.    Right Ear: Tympanic membrane and ear canal normal. No drainage or tenderness. No middle ear effusion. Tympanic membrane is not erythematous.     Left Ear: Tympanic membrane and ear canal normal. No drainage or tenderness.  No middle ear effusion. Tympanic membrane is not erythematous.     Nose: Congestion and rhinorrhea present.     Mouth/Throat:     Mouth: Mucous membranes are moist. No oral lesions.     Pharynx: No pharyngeal swelling, oropharyngeal exudate, posterior oropharyngeal erythema or uvula swelling.     Tonsils: No tonsillar exudate or tonsillar abscesses.     Comments: Significant postnasal drainage. Eyes:     Extraocular Movements: Extraocular movements intact.     Right eye: Normal extraocular motion.     Left eye: Normal extraocular motion.     Conjunctiva/sclera: Conjunctivae normal.     Pupils: Pupils are equal, round, and reactive to light.  Neck:     Musculoskeletal: Normal range of motion and neck supple.  Cardiovascular:     Rate and Rhythm: Normal rate and regular rhythm.     Pulses: Normal pulses.  Heart sounds: Normal heart sounds. No murmur. No friction rub. No gallop.   Pulmonary:     Effort: Pulmonary effort is normal. No respiratory distress.     Breath sounds: Normal breath sounds. No stridor. No wheezing, rhonchi or rales.  Lymphadenopathy:     Cervical: No cervical adenopathy.  Skin:    General: Skin is warm and dry.     Findings: No rash.  Neurological:     General: No focal deficit present.     Mental Status: She is alert and oriented to person, place, and time.  Psychiatric:        Mood and Affect: Mood normal.        Behavior: Behavior normal.         Thought Content: Thought content normal.      Assessment and Plan :   1. Rhinosinusitis   2. Sinus congestion   3. Cough   4. Atypical chest pain   5. Allergic rhinitis due to other allergic trigger, unspecified seasonality     Patient requested we use aggressive management.  We will have her start oral prednisone given her history of significant and severe allergic rhinitis.  Recommend supportive care otherwise.  Lung sounds are clear and therefore will hold off on doing a chest x-ray.  Labs pending including for COVID-19.  Counseled patient on use of prednisone with possibility of COVID-19.  Patient verbalized understanding and still wants to proceed with this course. Counseled patient on potential for adverse effects with medications prescribed/recommended today, ER and return-to-clinic precautions discussed, patient verbalized understanding.    Jaynee Eagles, [place] 04/17/19 1953

## 2019-04-17 NOTE — ED Triage Notes (Signed)
Pt presents with sinus issues; pt complains of nasal drainage X 2 days.

## 2019-04-19 LAB — NOVEL CORONAVIRUS, NAA (HOSP ORDER, SEND-OUT TO REF LAB; TAT 18-24 HRS): SARS-CoV-2, NAA: NOT DETECTED

## 2019-04-21 ENCOUNTER — Encounter (HOSPITAL_COMMUNITY): Payer: Self-pay

## 2019-05-30 ENCOUNTER — Other Ambulatory Visit: Payer: Self-pay | Admitting: Emergency Medicine

## 2019-05-30 DIAGNOSIS — Z20822 Contact with and (suspected) exposure to covid-19: Secondary | ICD-10-CM

## 2019-05-31 LAB — NOVEL CORONAVIRUS, NAA: SARS-CoV-2, NAA: NOT DETECTED

## 2020-02-18 ENCOUNTER — Encounter (HOSPITAL_COMMUNITY): Payer: Self-pay

## 2020-02-18 ENCOUNTER — Other Ambulatory Visit: Payer: Self-pay

## 2020-02-18 ENCOUNTER — Ambulatory Visit (HOSPITAL_COMMUNITY)
Admission: EM | Admit: 2020-02-18 | Discharge: 2020-02-18 | Disposition: A | Discharge status: Home / Self Care | Payer: BC Managed Care – PPO | Attending: Internal Medicine | Admitting: Internal Medicine

## 2020-02-18 DIAGNOSIS — M25562 Pain in left knee: Secondary | ICD-10-CM

## 2020-02-18 DIAGNOSIS — M25561 Pain in right knee: Secondary | ICD-10-CM

## 2020-02-18 DIAGNOSIS — G8929 Other chronic pain: Secondary | ICD-10-CM

## 2020-02-18 MED ORDER — KETOROLAC TROMETHAMINE 60 MG/2ML IM SOLN
60.0000 mg | Freq: Once | INTRAMUSCULAR | Status: AC
  Administered 2020-02-18: 60 mg via INTRAMUSCULAR

## 2020-02-18 MED ORDER — KETOROLAC TROMETHAMINE 60 MG/2ML IM SOLN
INTRAMUSCULAR | Status: AC
  Filled 2020-02-18: qty 2

## 2020-02-18 MED ORDER — MELOXICAM 15 MG PO TABS: 15.0000 mg | ORAL_TABLET | Freq: Every day | ORAL | 1 refills | Status: DC
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
## 2020-02-18 NOTE — Discharge Instructions (Addendum)
Use compression sleeve as needed for comfort. Take Meloxicam once daily as needed for discomfort.

## 2020-02-18 NOTE — ED Triage Notes (Signed)
Pt c/o left knee pain x 2 weeks, Tylenol and Advil not helping. "It feels like it's bone on bone."

## 2020-03-09 ENCOUNTER — Ambulatory Visit (HOSPITAL_COMMUNITY)
Admission: EM | Admit: 2020-03-09 | Discharge: 2020-03-09 | Discharge status: Against Medical Advice | Payer: BC Managed Care – PPO | Attending: Family Medicine | Admitting: Family Medicine

## 2020-03-09 ENCOUNTER — Other Ambulatory Visit: Payer: Self-pay

## 2020-03-12 ENCOUNTER — Other Ambulatory Visit: Payer: Self-pay

## 2020-03-12 ENCOUNTER — Encounter (HOSPITAL_COMMUNITY): Payer: Self-pay

## 2020-03-12 ENCOUNTER — Ambulatory Visit (HOSPITAL_COMMUNITY)
Admission: EM | Admit: 2020-03-12 | Discharge: 2020-03-12 | Disposition: A | Discharge status: Home / Self Care | Payer: BC Managed Care – PPO | Attending: Family Medicine | Admitting: Family Medicine

## 2020-03-12 DIAGNOSIS — H00014 Hordeolum externum left upper eyelid: Secondary | ICD-10-CM | POA: Diagnosis not present

## 2020-03-12 DIAGNOSIS — L301 Dyshidrosis [pompholyx]: Secondary | ICD-10-CM

## 2020-03-12 MED ORDER — ERYTHROMYCIN 5 MG/GM OP OINT: TOPICAL_OINTMENT | OPHTHALMIC | 0 refills | Status: DC

## 2020-03-12 MED ORDER — TRIAMCINOLONE ACETONIDE 0.1 % EX CREA: 1.0000 "application " | TOPICAL_CREAM | Freq: Two times a day (BID) | CUTANEOUS | 0 refills | Status: DC

## 2020-03-12 NOTE — ED Provider Notes (Signed)
[place]    CSN: [id number] Arrival date & time: 03/12/20  1150      History   Chief Complaint Chief Complaint  Patient presents with  . Eye Pain    HPI Toni Kennedy is a 54 y.o. female.   Patient is a 54 year old female presents today with left upper eyelid tenderness, swelling for 2 weeks. Symptoms have been constant. She has not done anything to treat the symptoms. Denies any specific eye pain or changes in vision. No drainage from the eye. No fevers or chills.  She is also had rash to left medial foot with small bumps. This is been a waxing waning issue for the last year. Somewhat itchy at times. Has not done anything to treat this either. Injuries to the foot.  Denies any fever, joint pain. Denies any recent changes in lotions, detergents, foods or other possible irritants. No recent travel. Nobody else at home has the rash. Patient has been outside but denies any contact with plants or insects. No new foods or medications.   ROS per HPI      Past Medical History:  Diagnosis Date  . Allergy   . Anemia   . Anxiety   . Arthritis   . Back complaints   . Blood in stool   . Cardiac arrhythmia   . Depression   . Heart murmur   . Hemorrhoid   . Hypotension    past hx   . Scoliosis     Patient Active Problem List   Diagnosis Date Noted  . Chronic bilateral low back pain without sciatica 11/15/2017  . Encounter for screening colonoscopy 03/27/2017  . Rectal bleeding 03/27/2017  . External hemorrhoid 03/27/2017  . Routine adult health maintenance 03/14/2017  . Obesity 03/14/2017  . 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 Tibialis posterior tendinitis 02/16/2015    Past Surgical History:  Procedure Laterality Date  . CESAREAN SECTION    . PATELLA FRACTURE SURGERY Left   . TUBAL LIGATION      OB History   No obstetric history on file.      Home Medications    Prior to Admission medications   Medication Sig Start  Date End Date Taking? Authorizing Provider  erythromycin ophthalmic ointment Place a 1/2 inch ribbon of ointment into the lower eyelid up to 6 times a day. 03/12/20   Loura Halt A, NP  meloxicam (MOBIC) 15 MG tablet Take 1 tablet (15 mg total) by mouth daily. 02/18/20 02/17/21  Rudolpho Sevin, NP  triamcinolone cream (KENALOG) 0.1 % Apply 1 application topically 2 (two) times daily. 03/12/20   Orvan July, NP    Family History Family History  Problem Relation Age of Onset  . Arthritis Mother   . Hyperlipidemia Mother   . Hypertension Mother   . Diabetes Mother   . Heart disease Father   . Heart disease Maternal Grandmother   . Colon cancer Neg Hx   . Colon polyps Neg Hx   . Esophageal cancer Neg Hx   . Rectal cancer Neg Hx   . Stomach cancer Neg Hx     Social History Social History   Tobacco Use  . Smoking status: Current Some Day Smoker    Types: Cigars  . Smokeless tobacco: Never Used  Vaping Use  . Vaping Use: Never used  Substance Use Topics  . Alcohol use: No  . Drug use: No     Allergies   Sulfa antibiotics  and Tetracyclines & related   Review of Systems Review of Systems   Physical Exam Triage Vital Signs ED Triage Vitals  Enc Vitals Group     BP 03/12/20 1255 (!) 156/53     Pulse Rate 03/12/20 1255 79     Resp 03/12/20 1255 16     Temp 03/12/20 1255 97.9 F (36.6 C)     Temp src --      SpO2 03/12/20 1255 98 %     Weight --      Height --      Head Circumference --      Peak Flow --      Pain Score 03/12/20 1256 5     Pain Loc --      Pain Edu? --      Excl. in [place]? --    No data found.  Updated Vital Signs BP (!) 156/53   Pulse 79   Temp 97.9 F (36.6 C)   Resp 16   SpO2 98%   Visual Acuity Right Eye Distance:   Left Eye Distance:   Bilateral Distance:    Right Eye Near:   Left Eye Near:    Bilateral Near:     Physical Exam   UC Treatments / Results  Labs (all labs ordered are listed, but only abnormal results are  displayed) Labs Reviewed - No data to display  EKG   Radiology No results found.  Procedures Procedures (including critical care time)  Medications Ordered in UC Medications - No data to display  Initial Impression / Assessment and Plan / UC Course  I have reviewed the triage vital signs and the nursing notes.  Pertinent labs & imaging results that were available during my care of the patient were reviewed by me and considered in my medical decision making (see chart for details).     Stye of left upper lid Warm compresses Massage the area Erythromycin ointment  Dyshidrotic eczema Triamcinolone cream to treat.  Recommended Eucerin cream or emollient over-the-counter Follow up as needed for continued or worsening symptoms  Final Clinical Impressions(s) / UC Diagnoses   Final diagnoses:  Hordeolum externum of left upper eyelid  Dyshidrotic eczema     Discharge Instructions     Make sure you are doing warm compresses or hot compresses to the eye  lid multiple times a day. Massage the area. Erythromycin ointment to prevent infection Believe this is dyshidrotic eczema on your foot. You can do triamcinolone cream. Make sure you do some Eucerin cream or eczema cream to keep feet moisturized you can get this over-the-counter.    ED Prescriptions    Medication Sig Dispense Auth. Provider   erythromycin ophthalmic ointment Place a 1/2 inch ribbon of ointment into the lower eyelid up to 6 times a day. 1 g Apryl Brymer A, NP   triamcinolone cream (KENALOG) 0.1 % Apply 1 application topically 2 (two) times daily. 30 g Loura Halt A, NP     PDMP not reviewed this encounter.   Orvan July, NP 03/12/20 1336

## 2020-03-12 NOTE — Discharge Instructions (Addendum)
Make sure you are doing warm compresses or hot compresses to the eye  lid multiple times a day. Massage the area. Erythromycin ointment to prevent infection Believe this is dyshidrotic eczema on your foot. You can do triamcinolone cream. Make sure you do some Eucerin cream or eczema cream to keep feet moisturized you can get this over-the-counter.

## 2020-03-12 NOTE — ED Triage Notes (Signed)
Pt c/o pain and swelling to left upper eyelid x 2 weeks, denies vision changes

## 2020-06-15 ENCOUNTER — Encounter (HOSPITAL_COMMUNITY): Payer: Self-pay | Admitting: Emergency Medicine

## 2020-06-15 ENCOUNTER — Other Ambulatory Visit: Payer: Self-pay

## 2020-06-15 ENCOUNTER — Ambulatory Visit (HOSPITAL_COMMUNITY)
Admission: EM | Admit: 2020-06-15 | Discharge: 2020-06-15 | Disposition: A | Discharge status: Home / Self Care | Payer: BC Managed Care – PPO | Attending: Emergency Medicine | Admitting: Emergency Medicine

## 2020-06-15 DIAGNOSIS — Z20822 Contact with and (suspected) exposure to covid-19: Secondary | ICD-10-CM | POA: Insufficient documentation

## 2020-06-15 DIAGNOSIS — F17290 Nicotine dependence, other tobacco product, uncomplicated: Secondary | ICD-10-CM | POA: Diagnosis not present

## 2020-06-15 DIAGNOSIS — J0100 Acute maxillary sinusitis, unspecified: Secondary | ICD-10-CM | POA: Diagnosis not present

## 2020-06-15 DIAGNOSIS — Z882 Allergy status to sulfonamides status: Secondary | ICD-10-CM | POA: Insufficient documentation

## 2020-06-15 DIAGNOSIS — K0889 Other specified disorders of teeth and supporting structures: Secondary | ICD-10-CM | POA: Insufficient documentation

## 2020-06-15 DIAGNOSIS — J3489 Other specified disorders of nose and nasal sinuses: Secondary | ICD-10-CM | POA: Diagnosis present

## 2020-06-15 LAB — SARS CORONAVIRUS 2 (TAT 6-24 HRS): SARS Coronavirus 2: NEGATIVE

## 2020-06-15 MED ORDER — IBUPROFEN 800 MG PO TABS: 800.0000 mg | ORAL_TABLET | Freq: Three times a day (TID) | ORAL | 0 refills | Status: DC | PRN

## 2020-06-15 MED ORDER — AMOXICILLIN 875 MG PO TABS: 875.0000 mg | ORAL_TABLET | Freq: Two times a day (BID) | ORAL | 0 refills | Status: AC

## 2020-06-15 NOTE — Discharge Instructions (Signed)
Take the antibiotic and ibuprofen as prescribed.    A dental resource guide is attached.  Please call to make an appointment with a dentist as soon as possible.    Go to the emergency department if you have acute worsening symptoms.

## 2020-06-15 NOTE — ED Triage Notes (Signed)
onset of feeling bad for 3-4 days.  Initially felt forehead pressure, facial pain, particularly right lower jaw.  Both ears are popping. Unknown if running a fever, patient has had episodes of sweating.  Slight stuffy nose/runny nose.  Has been taking sudafed, reports this does not work for very long

## 2020-06-15 NOTE — ED Provider Notes (Signed)
[place]    CSN: [id number] Arrival date & time: 06/15/20  1035      History   Chief Complaint Chief Complaint  Patient presents with  . URI    HPI Toni Kennedy is a 54 y.o. female.   Patient presents with sinus pressure, maxillary sinus pain, nasal congestion, and pain in her right lower tooth x4 days.  She denies fever, difficulty swallowing, cough, shortness of breath, or other symptoms.  Treatment attempted at home with Sudafed and ibuprofen.  Her medical history includes chronic low back pain, depression, anxiety, scoliosis, heart murmur, arthritis, anemia, hypotension, cardiac arrhythmia, obesity.  The history is provided by the patient and medical records.    Past Medical History:  Diagnosis Date  . Allergy   . Anemia   . Anxiety   . Arthritis   . Back complaints   . Blood in stool   . Cardiac arrhythmia   . Depression   . Heart murmur   . Hemorrhoid   . Hypotension    past hx   . Scoliosis     Patient Active Problem List   Diagnosis Date Noted  . Chronic bilateral low back pain without sciatica 11/15/2017  . Encounter for screening colonoscopy 03/27/2017  . Rectal bleeding 03/27/2017  . External hemorrhoid 03/27/2017  . Routine adult health maintenance 03/14/2017  . Obesity 03/14/2017  . 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 Tibialis posterior tendinitis 02/16/2015    Past Surgical History:  Procedure Laterality Date  . CESAREAN SECTION    . PATELLA FRACTURE SURGERY Left   . TUBAL LIGATION      OB History   No obstetric history on file.      Home Medications    Prior to Admission medications   Medication Sig Start Date End Date Taking? Authorizing Provider  amoxicillin (AMOXIL) 875 MG tablet Take 1 tablet (875 mg total) by mouth 2 (two) times daily for 7 days. 06/15/20 06/22/20  Sharion Balloon, NP  erythromycin ophthalmic ointment Place a 1/2 inch ribbon of ointment into the lower eyelid up to 6 times a  day. 03/12/20   Loura Halt A, NP  ibuprofen (ADVIL) 800 MG tablet Take 1 tablet (800 mg total) by mouth every 8 (eight) hours as needed. 06/15/20   Sharion Balloon, NP  meloxicam (MOBIC) 15 MG tablet Take 1 tablet (15 mg total) by mouth daily. 02/18/20 02/17/21  Rudolpho Sevin, NP  triamcinolone cream (KENALOG) 0.1 % Apply 1 application topically 2 (two) times daily. 03/12/20   Orvan July, NP    Family History Family History  Problem Relation Age of Onset  . Arthritis Mother   . Hyperlipidemia Mother   . Hypertension Mother   . Diabetes Mother   . Heart disease Father   . Heart disease Maternal Grandmother   . Colon cancer Neg Hx   . Colon polyps Neg Hx   . Esophageal cancer Neg Hx   . Rectal cancer Neg Hx   . Stomach cancer Neg Hx     Social History Social History   Tobacco Use  . Smoking status: Current Some Day Smoker    Types: Cigars  . Smokeless tobacco: Never Used  Vaping Use  . Vaping Use: Never used  Substance Use Topics  . Alcohol use: Yes  . Drug use: No     Allergies   Sulfa antibiotics and Tetracyclines & related   Review of Systems Review of Systems  Constitutional: Negative for chills and fever.  HENT: Positive for congestion, dental problem, sinus pressure and sinus pain. Negative for ear pain and sore throat.   Eyes: Negative for pain and visual disturbance.  Respiratory: Negative for cough and shortness of breath.   Cardiovascular: Negative for chest pain and palpitations.  Gastrointestinal: Negative for abdominal pain and vomiting.  Genitourinary: Negative for dysuria and hematuria.  Musculoskeletal: Negative for arthralgias and back pain.  Skin: Negative for color change and rash.  Neurological: Negative for seizures and syncope.  All other systems reviewed and are negative.    Physical Exam Triage Vital Signs ED Triage Vitals  Enc Vitals Group     BP      Pulse      Resp      Temp      Temp src      SpO2      Weight      Height       Head Circumference      Peak Flow      Pain Score      Pain Loc      Pain Edu?      Excl. in [place]?    No data found.  Updated Vital Signs BP 112/69 (BP Location: Right Arm)   Pulse 70   Temp 98.8 F (37.1 C) (Oral)   Resp 18   SpO2 99%   Visual Acuity Right Eye Distance:   Left Eye Distance:   Bilateral Distance:    Right Eye Near:   Left Eye Near:    Bilateral Near:     Physical Exam Vitals and nursing note reviewed.  Constitutional:      General: She is not in acute distress.    Appearance: She is well-developed. She is not ill-appearing.  HENT:     Head: Normocephalic and atraumatic.     Right Ear: Tympanic membrane normal.     Left Ear: Tympanic membrane normal.     Nose: Congestion present.     Mouth/Throat:     Mouth: Mucous membranes are moist.     Pharynx: Oropharynx is clear.   Eyes:     Conjunctiva/sclera: Conjunctivae normal.  Cardiovascular:     Rate and Rhythm: Normal rate and regular rhythm.     Heart sounds: No murmur heard.   Pulmonary:     Effort: Pulmonary effort is normal. No respiratory distress.     Breath sounds: Normal breath sounds.  Abdominal:     Palpations: Abdomen is soft.     Tenderness: There is no abdominal tenderness. There is no guarding or rebound.  Musculoskeletal:     Cervical back: Neck supple.  Skin:    General: Skin is warm and dry.     Findings: No rash.  Neurological:     General: No focal deficit present.     Mental Status: She is alert and oriented to person, place, and time.     Gait: Gait normal.  Psychiatric:        Mood and Affect: Mood normal.        Behavior: Behavior normal.      UC Treatments / Results  Labs (all labs ordered are listed, but only abnormal results are displayed) Labs Reviewed  SARS CORONAVIRUS 2 (TAT 6-24 HRS)    EKG   Radiology No results found.  Procedures Procedures (including critical care time)  Medications Ordered in UC Medications - No data to  display  Initial Impression / Assessment  and Plan / UC Course  I have reviewed the triage vital signs and the nursing notes.  Pertinent labs & imaging results that were available during my care of the patient were reviewed by me and considered in my medical decision making (see chart for details).   Dental pain and sinusitis.  Treating with amoxicillin and ibuprofen.  Dental resource guide provided and patient instructed to schedule an appointment with a dentist as soon as possible.  Instructed her to go to the ED if she has acute worsening symptoms.  Patient agrees to plan of care.   Final Clinical Impressions(s) / UC Diagnoses   Final diagnoses:  Pain, dental  Acute non-recurrent maxillary sinusitis     Discharge Instructions     Take the antibiotic and ibuprofen as prescribed.    A dental resource guide is attached.  Please call to make an appointment with a dentist as soon as possible.    Go to the emergency department if you have acute worsening symptoms.        ED Prescriptions    Medication Sig Dispense Auth. Provider   ibuprofen (ADVIL) 800 MG tablet Take 1 tablet (800 mg total) by mouth every 8 (eight) hours as needed. 21 tablet Sharion Balloon, NP   amoxicillin (AMOXIL) 875 MG tablet Take 1 tablet (875 mg total) by mouth 2 (two) times daily for 7 days. 14 tablet Sharion Balloon, NP     I have reviewed the PDMP during this encounter.   Sharion Balloon, NP 06/15/20 1245

## 2020-06-15 NOTE — ED Notes (Signed)
covid sample obtained, labeled and remains in treatment room with patient

## 2020-09-04 ENCOUNTER — Encounter (HOSPITAL_COMMUNITY): Payer: Self-pay | Admitting: Emergency Medicine

## 2020-09-04 ENCOUNTER — Ambulatory Visit (HOSPITAL_COMMUNITY)
Admission: EM | Admit: 2020-09-04 | Discharge: 2020-09-04 | Disposition: A | Discharge status: Home / Self Care | Payer: BC Managed Care – PPO | Attending: Student | Admitting: Student

## 2020-09-04 ENCOUNTER — Other Ambulatory Visit: Payer: Self-pay

## 2020-09-04 DIAGNOSIS — Z20822 Contact with and (suspected) exposure to covid-19: Secondary | ICD-10-CM

## 2020-09-04 DIAGNOSIS — J069 Acute upper respiratory infection, unspecified: Secondary | ICD-10-CM

## 2020-09-04 DIAGNOSIS — F5101 Primary insomnia: Secondary | ICD-10-CM

## 2020-09-04 LAB — SARS CORONAVIRUS 2 (TAT 6-24 HRS): SARS Coronavirus 2: NEGATIVE

## 2020-09-04 MED ORDER — AMOXICILLIN-POT CLAVULANATE 875-125 MG PO TABS: 1.0000 | ORAL_TABLET | Freq: Two times a day (BID) | ORAL | 0 refills | Status: DC

## 2020-09-04 NOTE — Discharge Instructions (Addendum)
-  Continue tylenol/ibuprofen, Dayquil/Nyquil, etc.  -If you continue to have sinus symptoms in 5 days (1/20), you can pick up the antibiotic - Augmentin bid x7 days. -Continue melatonin/tylenol PM as needed for insomnia.  We are currently awaiting result of your PCR covid-19 test. This typically comes back in 1-2 days. We'll call you if the result is positive. Otherwise, the result will be sent electronically to your MyChart. You can also call this clinic and ask for your result via telephone.   Please isolate at home while awaiting these results. If your test is positive for Covid-19, continue to isolate at home for 5 days if you have mild symptoms, or a total of 10 days from symptom onset if you have more severe symptoms. If you quarantine for a shorter period of time (i.e. 5 days), make sure to wear a mask until day 10 of symptoms. Treat your symptoms at home with OTC remedies like tylenol/ibuprofen, mucinex, nyquil, etc. Seek medical attention if you develop high fevers, chest pain, shortness of breath, ear pain, facial pain, etc. Make sure to get up and move around every 2-3 hours while convalescing to help prevent blood clots. Drink plenty of fluids, and rest as much as possible.

## 2020-09-04 NOTE — ED Provider Notes (Signed)
[place]    CSN: [id number] Arrival date & time: 09/04/20  1004      History   Chief Complaint Chief Complaint  Patient presents with  . Covid Exposure  . Nasal Congestion    HPI Toni Kennedy is a 55 y.o. female Presenting for URI symptoms for 2 days following covid exposure- daughter has covid. History of allergies, anemia, anxiety, depression, cardiac arrhythmia, hemorrhoid, hypotension, scoliosis. Presenting with nasal congestion, improved on Nyquil. Denies fevers/chills, n/v/d, shortness of breath, chest pain, cough, facial pain, teeth pain, headaches, sore throat, loss of taste/smell, swollen lymph nodes, ear pain. Denies chest pain, shortness of breath, confusion, high fevers.    HPI  Past Medical History:  Diagnosis Date  . Allergy   . Anemia   . Anxiety   . Arthritis   . Back complaints   . Blood in stool   . Cardiac arrhythmia   . Depression   . Heart murmur   . Hemorrhoid   . Hypotension    past hx   . Scoliosis     Patient Active Problem List   Diagnosis Date Noted  . Chronic bilateral low back pain without sciatica 11/15/2017  . Encounter for screening colonoscopy 03/27/2017  . Rectal bleeding 03/27/2017  . External hemorrhoid 03/27/2017  . Routine adult health maintenance 03/14/2017  . Obesity 03/14/2017  . 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 Tibialis posterior tendinitis 02/16/2015    Past Surgical History:  Procedure Laterality Date  . CESAREAN SECTION    . PATELLA FRACTURE SURGERY Left   . TUBAL LIGATION      OB History   No obstetric history on file.      Home Medications    Prior to Admission medications   Medication Sig Start Date End Date Taking? Authorizing Provider  amoxicillin-clavulanate (AUGMENTIN) 875-125 MG tablet Take 1 tablet by mouth every 12 (twelve) hours. 09/09/20  Yes Hazel Sams, PA-C  erythromycin ophthalmic ointment Place a 1/2 inch ribbon of ointment into the lower  eyelid up to 6 times a day. 03/12/20   Loura Halt A, NP  ibuprofen (ADVIL) 800 MG tablet Take 1 tablet (800 mg total) by mouth every 8 (eight) hours as needed. 06/15/20   Sharion Balloon, NP  meloxicam (MOBIC) 15 MG tablet Take 1 tablet (15 mg total) by mouth daily. 02/18/20 02/17/21  Rudolpho Sevin, NP  triamcinolone cream (KENALOG) 0.1 % Apply 1 application topically 2 (two) times daily. 03/12/20   Orvan July, NP    Family History Family History  Problem Relation Age of Onset  . Arthritis Mother   . Hyperlipidemia Mother   . Hypertension Mother   . Diabetes Mother   . Heart disease Father   . Heart disease Maternal Grandmother   . Colon cancer Neg Hx   . Colon polyps Neg Hx   . Esophageal cancer Neg Hx   . Rectal cancer Neg Hx   . Stomach cancer Neg Hx     Social History Social History   Tobacco Use  . Smoking status: Current Some Day Smoker    Types: Cigars  . Smokeless tobacco: Never Used  Vaping Use  . Vaping Use: Never used  Substance Use Topics  . Alcohol use: Yes  . Drug use: No     Allergies   Sulfa antibiotics and Tetracyclines & related   Review of Systems Review of Systems  Constitutional: Negative for appetite change, chills and fever.  HENT: Positive for congestion. Negative for ear pain, rhinorrhea, sinus pressure, sinus pain and sore throat.   Eyes: Negative for redness and visual disturbance.  Respiratory: Negative for cough, chest tightness, shortness of breath and wheezing.   Cardiovascular: Negative for chest pain and palpitations.  Gastrointestinal: Negative for abdominal pain, constipation, diarrhea, nausea and vomiting.  Genitourinary: Negative for dysuria, frequency and urgency.  Musculoskeletal: Negative for myalgias.  Neurological: Negative for dizziness, weakness and headaches.  Psychiatric/Behavioral: Negative for confusion.  All other systems reviewed and are negative.    Physical Exam Triage Vital Signs ED Triage Vitals [09/04/20  1031]  Enc Vitals Group     BP 132/90     Pulse Rate 65     Resp 18     Temp 98.7 F (37.1 C)     Temp Source Oral     SpO2 98 %     Weight      Height      Head Circumference      Peak Flow      Pain Score 0     Pain Loc      Pain Edu?      Excl. in [place]?    No data found.  Updated Vital Signs BP 132/90 (BP Location: Left Arm)   Pulse 65   Temp 98.7 F (37.1 C) (Oral)   Resp 18   SpO2 98%   Visual Acuity Right Eye Distance:   Left Eye Distance:   Bilateral Distance:    Right Eye Near:   Left Eye Near:    Bilateral Near:     Physical Exam Vitals reviewed.  Constitutional:      General: She is not in acute distress.    Appearance: Normal appearance. She is not ill-appearing.  HENT:     Head: Normocephalic and atraumatic.     Right Ear: Hearing, tympanic membrane, ear canal and external ear normal. No swelling or tenderness. There is no impacted cerumen. No mastoid tenderness. Tympanic membrane is not perforated, erythematous, retracted or bulging.     Left Ear: Hearing, tympanic membrane, ear canal and external ear normal. No swelling or tenderness. There is no impacted cerumen. No mastoid tenderness. Tympanic membrane is not perforated, erythematous, retracted or bulging.     Nose:     Right Sinus: No maxillary sinus tenderness or frontal sinus tenderness.     Left Sinus: No maxillary sinus tenderness or frontal sinus tenderness.     Mouth/Throat:     Mouth: Mucous membranes are moist.     Pharynx: Uvula midline. No oropharyngeal exudate or posterior oropharyngeal erythema.     Tonsils: No tonsillar exudate.  Cardiovascular:     Rate and Rhythm: Normal rate and regular rhythm.     Heart sounds: Normal heart sounds.  Pulmonary:     Breath sounds: Normal breath sounds and air entry. No wheezing, rhonchi or rales.  Chest:     Chest wall: No tenderness.  Abdominal:     General: Abdomen is flat. Bowel sounds are normal.     Tenderness: There is no abdominal  tenderness. There is no guarding or rebound.  Lymphadenopathy:     Cervical: No cervical adenopathy.  Neurological:     General: No focal deficit present.     Mental Status: She is alert and oriented to person, place, and time.  Psychiatric:        Attention and Perception: Attention and perception normal.        Mood  and Affect: Mood and affect normal.        Behavior: Behavior normal. Behavior is cooperative.        Thought Content: Thought content normal.        Judgment: Judgment normal.      UC Treatments / Results  Labs (all labs ordered are listed, but only abnormal results are displayed) Labs Reviewed  SARS CORONAVIRUS 2 (TAT 6-24 HRS)    EKG   Radiology No results found.  Procedures Procedures (including critical care time)  Medications Ordered in UC Medications - No data to display  Initial Impression / Assessment and Plan / UC Course  I have reviewed the triage vital signs and the nursing notes.  Pertinent labs & imaging results that were available during my care of the patient were reviewed by me and considered in my medical decision making (see chart for details).     Given history of cardiac arrhythmia, rec against Sudafed use   Covid test sent today. Isolation precautions per CDC guidelines until negative result. Symptomatic relief with OTC Mucinex, Nyquil, etc. Return precautions- new/worsening fevers/chills, shortness of breath, chest pain, abd pain, etc.   Pt with 2 days of viral sinus symptoms. We discussed that antibiotic is not necessary at this time. Pt is adamant that she requires antibiotic for management of these symptoms; script for Augmentin sent, which she can pick up on 1/20 if she continue to have sinus symptoms after 7 days.   For insomnia, continue melatonin/tylenol PM as needed. Discussed maximum dose of tylenol.   Final Clinical Impressions(s) / UC Diagnoses   Final diagnoses:  Acute upper respiratory infection  Suspected  COVID-19 virus infection  Exposure to COVID-19 virus  Primary insomnia     Discharge Instructions     -Continue tylenol/ibuprofen, Dayquil/Nyquil, etc.  -You can try Sudafed for your sinus symptoms  -If you continue to have sinus symptoms in 5 days (1/20), you can pick up the antibiotic - Augmentin bid x7 days. -Continue melatonin/tylenol PM as needed for insomnia.  We are currently awaiting result of your PCR covid-19 test. This typically comes back in 1-2 days. We'll call you if the result is positive. Otherwise, the result will be sent electronically to your MyChart. You can also call this clinic and ask for your result via telephone.   Please isolate at home while awaiting these results. If your test is positive for Covid-19, continue to isolate at home for 5 days if you have mild symptoms, or a total of 10 days from symptom onset if you have more severe symptoms. If you quarantine for a shorter period of time (i.e. 5 days), make sure to wear a mask until day 10 of symptoms. Treat your symptoms at home with OTC remedies like tylenol/ibuprofen, mucinex, nyquil, etc. Seek medical attention if you develop high fevers, chest pain, shortness of breath, ear pain, facial pain, etc. Make sure to get up and move around every 2-3 hours while convalescing to help prevent blood clots. Drink plenty of fluids, and rest as much as possible.     ED Prescriptions    Medication Sig Dispense Auth. Provider   amoxicillin-clavulanate (AUGMENTIN) 875-125 MG tablet Take 1 tablet by mouth every 12 (twelve) hours. 14 tablet Hazel Sams, PA-C     PDMP not reviewed this encounter.   Hazel Sams, PA-C 09/04/20 1129

## 2020-09-04 NOTE — ED Triage Notes (Signed)
Pt presents with nasal congestion and runny congestion and dry cough xs 2 days. States daughter is COVID positive.

## 2020-10-05 ENCOUNTER — Encounter: Payer: Self-pay | Admitting: Gastroenterology

## 2020-10-06 ENCOUNTER — Other Ambulatory Visit: Payer: Self-pay

## 2020-10-06 ENCOUNTER — Encounter: Payer: Self-pay | Admitting: Obstetrics & Gynecology

## 2020-10-06 ENCOUNTER — Ambulatory Visit (INDEPENDENT_AMBULATORY_CARE_PROVIDER_SITE_OTHER): Payer: BC Managed Care – PPO | Admitting: Obstetrics & Gynecology

## 2020-10-06 VITALS — BP 124/78 | Ht 64.0 in | Wt 213.0 lb

## 2020-10-06 DIAGNOSIS — N951 Menopausal and female climacteric states: Secondary | ICD-10-CM

## 2020-10-06 DIAGNOSIS — E6609 Other obesity due to excess calories: Secondary | ICD-10-CM | POA: Diagnosis not present

## 2020-10-06 DIAGNOSIS — B372 Candidiasis of skin and nail: Secondary | ICD-10-CM | POA: Diagnosis not present

## 2020-10-06 DIAGNOSIS — Z6836 Body mass index (BMI) 36.0-36.9, adult: Secondary | ICD-10-CM

## 2020-10-06 DIAGNOSIS — Z01419 Encounter for gynecological examination (general) (routine) without abnormal findings: Secondary | ICD-10-CM

## 2020-10-06 MED ORDER — ESTRADIOL 0.05 MG/24HR TD PTWK: 0.0500 mg | MEDICATED_PATCH | TRANSDERMAL | 4 refills | Status: DC

## 2020-10-06 MED ORDER — PROGESTERONE MICRONIZED 100 MG PO CAPS: 100.0000 mg | ORAL_CAPSULE | Freq: Every day | ORAL | 4 refills | Status: DC

## 2020-10-06 MED ORDER — NYSTATIN 100000 UNIT/GM EX POWD: 1.0000 "application " | Freq: Three times a day (TID) | CUTANEOUS | 0 refills | Status: DC

## 2020-10-06 NOTE — Progress Notes (Signed)
Toni Kennedy [birth year] [id number]   History:    55 y.o. G3P2A1L2 Stable boyfriend  RP:  New patient presenting for annual gyn exam   HPI: Postmenopause x 3 years.  No PMB.  Worsening hot flushes and night sweats every day and most nights.  Dryness with IC.  Would like to start on HRT.  No h/o HTN, PE/Stroke and no Fam h/o breast Ca.  Breasts normal.  Urine/BMs normal.  BMI 36.56.  Health labs with Fam MD.  Will call Gastro for repeat [place].  Past medical history,surgical history, family history and social history were all reviewed and documented in the EPIC chart.  Gynecologic History No LMP recorded. (Menstrual status: Perimenopausal).  Obstetric History OB History  Gravida Para Term Preterm AB Living  3 2     1 2   SAB IAB Ectopic Multiple Live Births               # Outcome Date GA Lbr Len/2nd Weight Sex Delivery Anes PTL Lv  3 AB           2 Para           1 Para              ROS: A ROS was performed and pertinent positives and negatives are included in the history.  GENERAL: No fevers or chills. HEENT: No change in vision, no earache, sore throat or sinus congestion. NECK: No pain or stiffness. CARDIOVASCULAR: No chest pain or pressure. No palpitations. PULMONARY: No shortness of breath, cough or wheeze. GASTROINTESTINAL: No abdominal pain, nausea, vomiting or diarrhea, melena or bright red blood per rectum. GENITOURINARY: No urinary frequency, urgency, hesitancy or dysuria. MUSCULOSKELETAL: No joint or muscle pain, no back pain, no recent trauma. DERMATOLOGIC: No rash, no itching, no lesions. ENDOCRINE: No polyuria, polydipsia, no heat or cold intolerance. No recent change in weight. HEMATOLOGICAL: No anemia or easy bruising or bleeding. NEUROLOGIC: No headache, seizures, numbness, tingling or weakness. PSYCHIATRIC: No depression, no loss of interest in normal activity or change in sleep pattern.     Exam:   BP 124/78   Ht 5\' 4"  (1.626 m)   Wt 213 lb (96.6 kg)    BMI 36.56 kg/m   Body mass index is 36.56 kg/m.  General appearance : Well developed well nourished female. No acute distress HEENT: Eyes: no retinal hemorrhage or exudates,  Neck supple, trachea midline, no carotid bruits, no thyroidmegaly Lungs: Clear to auscultation, no rhonchi or wheezes, or rib retractions  Heart: Regular rate and rhythm, no murmurs or gallops Breast:Examined in sitting and supine position were symmetrical in appearance, no palpable masses or tenderness,  no skin retraction, no nipple inversion, no nipple discharge, no skin discoloration, no axillary or supraclavicular lymphadenopathy Abdomen: no palpable masses or tenderness, no rebound or guarding Extremities: no edema or skin discoloration or tenderness  Pelvic: Vulva: Normal             Vagina: No gross lesions or discharge  Cervix: No gross lesions or discharge.  Pap reflex done.  Uterus  AV, normal size, shape and consistency, non-tender and mobile  Adnexa  Without masses or tenderness  Anus: Normal   Assessment/Plan:  55 y.o. female for annual exam   1. Encounter for routine gynecological examination with Papanicolaou smear of cervix Normal gynecologic exam in menopause.  Pap reflex done today.  Breast exam normal.  Overdue for screening mammogram, will schedule.  Colonoscopy 2018.  Recommend health  labs with family physician.  2. Postmenopausal syndrome Very symptomatic menopause with hot flashes and night sweats.  No contraindication to hormone replacement therapy.  Risks and benefits thoroughly reviewed with patient.  Counseling done on different hormonal regimens.  Decision to start with estradiol 0.05 patch once a week and progesterone 100 mg capsule at bedtime.  Usage reviewed and prescription sent to pharmacy.  3. Yeast infection of the skin Probable yeast infection under the breasts.  Nystatin powder usage reviewed and prescription sent to pharmacy.    4. Class 2 severe obesity due to excess  calories with serious comorbidity and body mass index (BMI) of 35.0 to 35.9 in adult [facility]) Body mass index 36.56.  Recommend a lower calorie/carb diet.  Aerobic activities 5 times a week and light weightlifting every 2 days recommended.  Other orders - nystatin (MYCOSTATIN/NYSTOP) powder; Apply 1 application topically 3 (three) times daily. - progesterone (PROMETRIUM) 100 MG capsule; Take 1 capsule (100 mg total) by mouth at bedtime. - estradiol (CLIMARA - DOSED IN MG/24 HR) 0.05 mg/24hr patch; Place 1 patch (0.05 mg total) onto the skin once a week.  Princess Bruins MD, 1:56 PM 10/06/2020

## 2020-10-07 LAB — PAP IG W/ RFLX HPV ASCU

## 2020-10-16 ENCOUNTER — Encounter: Payer: Self-pay | Admitting: Obstetrics & Gynecology

## 2021-05-03 ENCOUNTER — Ambulatory Visit
Admission: RE | Admit: 2021-05-03 | Discharge: 2021-05-03 | Disposition: A | Discharge status: Home / Self Care | Payer: BC Managed Care – PPO | Source: Ambulatory Visit | Attending: Pain Medicine | Admitting: Pain Medicine

## 2021-05-03 ENCOUNTER — Other Ambulatory Visit: Payer: Self-pay | Admitting: Pain Medicine

## 2021-05-03 DIAGNOSIS — M25551 Pain in right hip: Secondary | ICD-10-CM

## 2021-05-03 DIAGNOSIS — M25561 Pain in right knee: Secondary | ICD-10-CM

## 2021-05-03 DIAGNOSIS — M25562 Pain in left knee: Secondary | ICD-10-CM

## 2021-05-03 DIAGNOSIS — M545 Low back pain, unspecified: Secondary | ICD-10-CM

## 2021-07-18 ENCOUNTER — Telehealth: Payer: Self-pay

## 2021-07-18 NOTE — Telephone Encounter (Signed)
I spoke with Dr. Dellis Filbert regarding this patient's situation. She recommended office visit and u/s if we can get it this week. She is scheduled for Thursday for u/s and visit with Dr. Marguerita Merles.  CT scan result printed from [facility] and placed on Dr. Assunta Curtis desk.

## 2021-07-18 NOTE — Telephone Encounter (Signed)
Patient called. She states she has not had a period in years but the last 3 days she has seen vaginal bleeding. SHe went to the ER at [facility] (Not a [facility]) and CT scan was normal and urine was fine. They ruled out kidney stones/rectal polyp.  I asked her had she put anything in her vagina to know that is where it is coming from. She said she did put her finger in and bleeding was from vagina.  She said it mostly only happens when she urinates and the toilet is filled with bright red blood. When she has a BM and pushes it is even worse. SHe said it has tapered a little today but still heavy. In between bathroom trips she is wearing a panty liner.  Dr. Talbert Nan has 11:00am appt open tomorrow.  Please advise.

## 2021-07-21 ENCOUNTER — Other Ambulatory Visit: Payer: BC Managed Care – PPO | Admitting: Obstetrics & Gynecology

## 2021-07-21 ENCOUNTER — Other Ambulatory Visit: Payer: BC Managed Care – PPO

## 2021-08-11 ENCOUNTER — Other Ambulatory Visit (INDEPENDENT_AMBULATORY_CARE_PROVIDER_SITE_OTHER): Payer: BC Managed Care – PPO

## 2021-08-11 ENCOUNTER — Other Ambulatory Visit: Payer: Self-pay | Admitting: Anesthesiology

## 2021-08-11 ENCOUNTER — Other Ambulatory Visit: Payer: Self-pay

## 2021-08-11 ENCOUNTER — Ambulatory Visit (INDEPENDENT_AMBULATORY_CARE_PROVIDER_SITE_OTHER): Payer: BC Managed Care – PPO | Admitting: Obstetrics & Gynecology

## 2021-08-11 ENCOUNTER — Encounter: Payer: Self-pay | Admitting: Obstetrics & Gynecology

## 2021-08-11 VITALS — BP 118/78 | HR 55

## 2021-08-11 DIAGNOSIS — N950 Postmenopausal bleeding: Secondary | ICD-10-CM | POA: Diagnosis not present

## 2021-08-11 DIAGNOSIS — R310 Gross hematuria: Secondary | ICD-10-CM | POA: Diagnosis not present

## 2021-08-11 NOTE — Progress Notes (Signed)
° ° °  Toni Kennedy [birth year] [id number]        55 y.o.  [id number]   RP: Postmenopausal bleeding x 3 days 3 weeks ago  HPI: Postmenopausal bleeding x 3 days 3 weeks ago.  Stopped HRT about 5 months ago.  Blood seen only when passing urine.  No PMS.  No pelvic cramps.  No UTI Sxs.  No fever.   OB History  Gravida Para Term Preterm AB Living  3 2     1 2   SAB IAB Ectopic Multiple Live Births               # Outcome Date GA Lbr Len/2nd Weight Sex Delivery Anes PTL Lv  3 AB           2 Para           1 Para             Past medical history,surgical history, problem list, medications, allergies, family history and social history were all reviewed and documented in the EPIC chart.   Directed ROS with pertinent positives and negatives documented in the history of present illness/assessment and plan.  Exam:  Vitals:   08/11/21 0847  BP: 118/78  Pulse: (!) 55  SpO2: 97%   General appearance:  Normal  Gyn exam:  Vulva normal.  Bimanual exam:  Uterus AV, mobile, normal volume, NT.  No adnexal mass, NT.  No blood.  Pelvic US today: T/V images.  Anteverted uterus, normal in size and shape with no myometrial mass.  The uterus is measured at 8.01 x 3.93 x 3.39 cm.  The endometrial lining is thin and symmetrical measured at 2.72 mm with no mass or thickening seen.  Both ovaries are atrophic in appearance with good perfusion bilaterally.  No adnexal mass.  No free fluid in the posterior cul-de-sac.  U/A: Yellow cloudy, Pro Neg, Nit Neg, WBC 0-5, RBC 3-10, Bacteria few.  Pending U. Culture.   Assessment/Plan:  55 y.o. [id number]   1. Postmenopausal bleeding Postmenopausal bleeding x 3 days 3 weeks ago.  Stopped HRT about 5 months ago.  Blood seen only when passing urine.  No PMS.  No pelvic cramps.  No UTI Sxs.  No fever.  Pelvic US findings thoroughly reviewed.  The uterus is measured at 8.01 x 3.93 x 3.39 cm.  The endometrial lining is thin and symmetrical measured at 2.72 mm with no mass  or thickening seen.  Patient reassured.  PMB precautions reviewed.  2. Gross hematuria Postmenopausal bleeding x 3 days 3 weeks ago. Blood seen only when passing urine.  No UTI Sxs.  No fever.  No current gross hematuria, but micro hematuria present.  Decision to refer to Urology. - Urinalysis,Complete w/RFL Culture  Other orders - HYDROcodone-acetaminophen (NORCO) 7.5-325 MG tablet; TAKE 1 TABLET BY MOUTH FIVE TIMES DAILY AS NEEDED   Princess Bruins MD, 9:28 AM 08/11/2021

## 2021-08-13 LAB — URINALYSIS, COMPLETE W/RFL CULTURE
Bilirubin Urine: NEGATIVE
Casts: NONE SEEN /LPF
Glucose, UA: NEGATIVE
Hyaline Cast: NONE SEEN /LPF
Ketones, ur: NEGATIVE
Nitrites, Initial: NEGATIVE
Protein, ur: NEGATIVE
Specific Gravity, Urine: 1.025 (ref 1.001–1.035)
Yeast: NONE SEEN /HPF
pH: 5.5 (ref 5.0–8.0)

## 2021-08-13 LAB — URINE CULTURE
MICRO NUMBER:: 12790461
Result:: NO GROWTH
SPECIMEN QUALITY:: ADEQUATE

## 2021-08-13 LAB — CULTURE INDICATED

## 2021-08-16 ENCOUNTER — Telehealth: Payer: Self-pay

## 2021-08-16 NOTE — Telephone Encounter (Signed)
Referral request faxed to [facility].

## 2021-08-16 NOTE — Telephone Encounter (Signed)
I called patient and let her know that referral has been faxed and [facility] will call her to schedule visit. I provided her with the phone # in the event she would like to call them to arrange.

## 2021-08-16 NOTE — Telephone Encounter (Signed)
Toni Bruins, MD  P [facility] Triage Hematuria.   Had 3 days of gross hematuria.  Pelvic US showing a thin endometrial line, no evidence of Gyn cause of PMB.  U/A RBC 3-10/hpf.

## 2021-09-01 NOTE — Telephone Encounter (Signed)
Patient was seen on 08/17/21 with Dr. Claudia Desanctis.

## 2021-10-07 ENCOUNTER — Ambulatory Visit: Payer: BC Managed Care – PPO | Admitting: Obstetrics & Gynecology

## 2022-05-05 ENCOUNTER — Encounter: Payer: Self-pay | Admitting: Family

## 2022-05-05 ENCOUNTER — Ambulatory Visit (INDEPENDENT_AMBULATORY_CARE_PROVIDER_SITE_OTHER): Payer: BC Managed Care – PPO | Admitting: Family

## 2022-05-05 VITALS — BP 131/87 | HR 62 | Temp 97.8°F | Ht 64.0 in | Wt 203.2 lb

## 2022-05-05 DIAGNOSIS — Z01818 Encounter for other preprocedural examination: Secondary | ICD-10-CM | POA: Diagnosis not present

## 2022-05-05 NOTE — Progress Notes (Signed)
   New Patient Office Visit  Subjective:  Patient ID: Toni Kennedy, female    DOB: 08-19-1966  Age: 56 y.o. MRN: [id number]  CC:  Chief Complaint  Patient presents with   Establish Care   Surgical clearance    Right hip replacement surgery with [facility], No date scheduled yet.     HPI Toni Kennedy presents for establishing care today.  Pre-surgical clearance:  pt has form for right hip arthroplasty surgery clearance. Pt has had knee replacement surgery years ago, no problems with anesthesia, no history of pulmonary or cardiac problems. 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  Assessment & Plan:   Problem List Items Addressed This Visit   None Visit Diagnoses     Preop examination    -  Primary right hip arthroplasty via [place]. No surgery date yet. pt with no risk factors, no cardiac or pulmonary problems, no adverse anesthesia reactions in past, VSS. Pt does smoke occasionally, but understands she needs to stop completely prior to surgery. Surgical clearance form signed and faxed.      Subjective:    Outpatient Medications Prior to Visit  Medication Sig Dispense Refill   HYDROcodone-acetaminophen (NORCO) 7.5-325 MG tablet TAKE 1 TABLET BY MOUTH FIVE TIMES DAILY AS NEEDED     estradiol (CLIMARA - DOSED IN MG/24 HR) 0.05 mg/24hr patch Place 1 patch (0.05 mg total) onto the skin once a week. (Patient not taking: Reported on 05/05/2022) 12 patch 4   No facility-administered medications prior to visit.   Past Medical History:  Diagnosis Date   Allergy    Anemia    Anxiety    Arthritis    Back complaints    Blood in stool    Cardiac arrhythmia    Depression    Heart murmur    Hemorrhoid    Hypotension    past hx    Scoliosis    Past Surgical History:  Procedure Laterality Date   CESAREAN SECTION     PATELLA FRACTURE SURGERY Left    TUBAL LIGATION      Objective:   Today's Vitals: BP 131/87 (BP  Location: Left Arm, Patient Position: Sitting, Cuff Size: Large)   Pulse 62   Temp 97.8 F (36.6 C) (Temporal)   Ht '5\' 4"'$  (1.626 m)   Wt 203 lb 3.2 oz (92.2 kg)   SpO2 97%   BMI 34.88 kg/m   Physical Exam Vitals and nursing note reviewed.  Constitutional:      Appearance: Normal appearance.  Cardiovascular:     Rate and Rhythm: Normal rate and regular rhythm.  Pulmonary:     Effort: Pulmonary effort is normal.     Breath sounds: Normal breath sounds.  Musculoskeletal:     Right hip: Bony tenderness and crepitus present. Decreased range of motion. Decreased strength.  Skin:    General: Skin is warm and dry.  Neurological:     Mental Status: She is alert.  Psychiatric:        Mood and Affect: Mood normal.        Behavior: Behavior normal.     Jeanie Sewer, NP

## 2022-05-05 NOTE — Patient Instructions (Signed)
Welcome to [facility], It was a pleasure meeting you today!    We will fax your surgical clearance form today.  Good luck with your surgery!  Schedule a follow up visit after your surgery and we can check a few fasting labs.    PLEASE NOTE: If you had any LAB tests please let us know if you have not heard back within a few days. You may see your results on MyChart before we have a chance to review them but we will give you a call once they are reviewed by [place]. If we ordered any REFERRALS today, please let us know if you have not heard from their office within the next week.  Let us know through MyChart if you are needing REFILLS, or have your pharmacy send [place] the request. You can also use MyChart to communicate with me or any office staff.   Please try these tips to maintain a healthy lifestyle:  Eat most of your calories during the day when you are active. Eliminate processed foods including packaged sweets (pies, cakes, cookies), reduce intake of potatoes, white bread, white pasta, and white rice. Look for whole grain options, oat flour or almond flour.  Each meal should contain half fruits/vegetables, one quarter protein, and one quarter carbs (no bigger than a computer mouse).  Cut down on sweet beverages. This includes juice, soda, and sweet tea. Also watch fruit intake, though this is a healthier sweet option, it still contains natural sugar! Limit to 3 servings daily.  Drink at least 1 8oz. glass of water with each meal and aim for at least 8 glasses per day  Exercise at least 150 minutes every week.

## 2022-05-08 ENCOUNTER — Telehealth: Payer: Self-pay | Admitting: Family

## 2022-05-08 NOTE — Telephone Encounter (Signed)
Paperwork refaxed.

## 2022-05-08 NOTE — Telephone Encounter (Signed)
Caller states: -Surgical clearance form was received but office notes and updated labs were not   Caller requests: -Patient's office notes and update labs be faxed to either [phone] or [phone] by end of day, September 18,2023  so patient can be scheduled for surgery

## 2022-05-15 ENCOUNTER — Encounter: Payer: Self-pay | Admitting: *Deleted

## 2022-06-23 ENCOUNTER — Telehealth: Payer: Self-pay | Admitting: Family

## 2022-06-23 ENCOUNTER — Ambulatory Visit (HOSPITAL_COMMUNITY)
Admission: EM | Admit: 2022-06-23 | Discharge: 2022-06-23 | Disposition: A | Discharge status: Home / Self Care | Payer: BC Managed Care – PPO

## 2022-06-23 ENCOUNTER — Ambulatory Visit: Payer: Self-pay | Admitting: *Deleted

## 2022-06-23 ENCOUNTER — Encounter (HOSPITAL_COMMUNITY): Payer: Self-pay | Admitting: Emergency Medicine

## 2022-06-23 ENCOUNTER — Encounter (HOSPITAL_COMMUNITY): Payer: Self-pay

## 2022-06-23 ENCOUNTER — Emergency Department (HOSPITAL_COMMUNITY): Payer: BC Managed Care – PPO

## 2022-06-23 ENCOUNTER — Emergency Department (HOSPITAL_COMMUNITY)
Admission: EM | Admit: 2022-06-23 | Discharge: 2022-06-24 | Disposition: A | Discharge status: Home / Self Care | Payer: BC Managed Care – PPO | Attending: Emergency Medicine | Admitting: Emergency Medicine

## 2022-06-23 DIAGNOSIS — M7071 Other bursitis of hip, right hip: Secondary | ICD-10-CM | POA: Diagnosis not present

## 2022-06-23 DIAGNOSIS — G8918 Other acute postprocedural pain: Secondary | ICD-10-CM

## 2022-06-23 DIAGNOSIS — M719 Bursopathy, unspecified: Secondary | ICD-10-CM

## 2022-06-23 DIAGNOSIS — R200 Anesthesia of skin: Secondary | ICD-10-CM | POA: Diagnosis present

## 2022-06-23 DIAGNOSIS — M5030 Other cervical disc degeneration, unspecified cervical region: Secondary | ICD-10-CM | POA: Insufficient documentation

## 2022-06-23 DIAGNOSIS — Y9389 Activity, other specified: Secondary | ICD-10-CM | POA: Diagnosis not present

## 2022-06-23 LAB — CBC WITH DIFFERENTIAL/PLATELET
Abs Immature Granulocytes: 0.04 10*3/uL (ref 0.00–0.07)
Basophils Absolute: 0 10*3/uL (ref 0.0–0.1)
Basophils Relative: 0 %
Eosinophils Absolute: 0.1 10*3/uL (ref 0.0–0.5)
Eosinophils Relative: 2 %
HCT: 40.7 % (ref 36.0–46.0)
Hemoglobin: 12.7 g/dL (ref 12.0–15.0)
Immature Granulocytes: 1 %
Lymphocytes Relative: 47 %
Lymphs Abs: 2.3 10*3/uL (ref 0.7–4.0)
MCH: 28.3 pg (ref 26.0–34.0)
MCHC: 31.2 g/dL (ref 30.0–36.0)
MCV: 90.6 fL (ref 80.0–100.0)
Monocytes Absolute: 0.4 10*3/uL (ref 0.1–1.0)
Monocytes Relative: 7 %
Neutro Abs: 2.1 10*3/uL (ref 1.7–7.7)
Neutrophils Relative %: 43 %
Platelets: 337 10*3/uL (ref 150–400)
RBC: 4.49 MIL/uL (ref 3.87–5.11)
RDW: 13 % (ref 11.5–15.5)
WBC: 4.8 10*3/uL (ref 4.0–10.5)
nRBC: 0 % (ref 0.0–0.2)

## 2022-06-23 LAB — COMPREHENSIVE METABOLIC PANEL
ALT: 13 U/L (ref 0–44)
AST: 13 U/L — ABNORMAL LOW (ref 15–41)
Albumin: 4.3 g/dL (ref 3.5–5.0)
Alkaline Phosphatase: 93 U/L (ref 38–126)
Anion gap: 8 (ref 5–15)
BUN: 16 mg/dL (ref 6–20)
CO2: 28 mmol/L (ref 22–32)
Calcium: 9.3 mg/dL (ref 8.9–10.3)
Chloride: 102 mmol/L (ref 98–111)
Creatinine, Ser: 0.59 mg/dL (ref 0.44–1.00)
GFR, Estimated: >60 mL/min (ref >60)
Glucose, Bld: 98 mg/dL (ref 70–99)
Potassium: 3.7 mmol/L (ref 3.5–5.1)
Sodium: 138 mmol/L (ref 135–145)
Total Bilirubin: 0.6 mg/dL (ref 0.3–1.2)
Total Protein: 8.3 g/dL — ABNORMAL HIGH (ref 6.5–8.1)

## 2022-06-23 LAB — I-STAT BETA HCG BLOOD, ED (MC, WL, AP ONLY): I-stat hCG, quantitative: <5 m[IU]/mL (ref <5)

## 2022-06-23 MED ORDER — ONDANSETRON HCL 4 MG/2ML IJ SOLN
4.0000 mg | Freq: Once | INTRAMUSCULAR | Status: AC
  Administered 2022-06-23: 4 mg via INTRAVENOUS
  Filled 2022-06-23: qty 2

## 2022-06-23 MED ORDER — GADOBUTROL 1 MMOL/ML IV SOLN
9.0000 mL | Freq: Once | INTRAVENOUS | Status: AC | PRN
  Administered 2022-06-23: 9 mL via INTRAVENOUS

## 2022-06-23 MED ORDER — HYDROMORPHONE HCL 1 MG/ML IJ SOLN
0.5000 mg | Freq: Once | INTRAMUSCULAR | Status: AC
  Administered 2022-06-23: 0.5 mg via INTRAVENOUS
  Filled 2022-06-23: qty 1

## 2022-06-23 NOTE — ED Notes (Signed)
Pt ambulated to BR with cane, no assistance needed, no difficulty reported or noted

## 2022-06-23 NOTE — Telephone Encounter (Signed)
Pt states: -Hip replacement surgery on 06/09/22 -Experiencing pain at injections site of epidural. -Experiencing radiating pain in left arm and feeling of heaviness -Been occurring for several days.   Pt warm transferred to [facility], patient coordinator with PCP Triage / Team Health Nurse.  Awaiting follow up notes.

## 2022-06-23 NOTE — ED Provider Notes (Signed)
[facility] DEPT Provider Note   CSN: [id number] Arrival date & time: 06/23/22  1719     History  Chief Complaint  Patient presents with   Arm Pain   Back Pain    Toni Kennedy is a 56 y.o. female with obesity, history of hemorrhoids presents with back pain and arm pain.  Patient presents with gradual onset severe 10 of 10 thoracic back pain that radiates into her left arm causing left arm aching and intermittent numbness and tingling in her left fingers.  Patient reports this began approximately 10 days ago and has been worsening since then.  On 10/20 patient had a right hip replacement and had a lumbar puncture associated with this procedure.  She did not have pain in her back immediately after but it developed approximately 1 week after.  Patient has no history of malignancy, no history of IVDU.  She has not had any fevers or chills.  She has not fallen or had any trauma to the back.  Patient has no saddle anesthesia, urinary or bowel incontinence, no urinary retention or constipation.  Patient states that she is not walking very well due to the pain in her right hip associated with the right hip arthroplasty and the pain in her back but she does not feel weak in her legs.  She has not noticed any weakness in the arm either.  Denies any headaches, visual changes, chest pain, shortness of breath, abdominal pain, nausea vomiting diarrhea constipation.    Arm Pain  Back Pain      Home Medications Prior to Admission medications   Medication Sig Start Date End Date Taking? Authorizing Provider  HYDROcodone-acetaminophen ([place]) 7.5-325 MG tablet TAKE 1 TABLET BY MOUTH FIVE TIMES DAILY AS NEEDED 05/26/21   [provider]      Allergies    Sulfa antibiotics, Sulfur, Tetracaine, and Tetracyclines & related    Review of Systems   Review of Systems  Musculoskeletal:  Positive for back pain.   Review of systems negative for fever/chills.  A  10 point review of systems was performed and is negative unless otherwise reported in HPI.  Physical Exam Updated Vital Signs BP (!) 127/92 (BP Location: Right Arm)   Pulse 62   Temp 98.1 F (36.7 C) (Oral)   Resp 18   SpO2 98%  Physical Exam General: Normal appearing female, lying in bed.  HEENT: PERRLA, Sclera anicteric, MMM, trachea midline. Cardiology: RRR, no murmurs/rubs/gallops. BL radial and DP pulses equal bilaterally.  Resp: Normal respiratory rate and effort. CTAB, no wheezes, rhonchi, crackles.  Abd: Soft, non-tender, non-distended. No rebound tenderness or guarding.  GU: Deferred. MSK: Well-healing anterior right hip arthroplasty incision without any erythema or purulent drainage.  No peripheral edema or signs of trauma. Extremities without deformity or TTP. No cyanosis or clubbing. Skin: warm, dry. No rashes or lesions. Back: Tenderness to palpation in the T1 area in the midline without any overlying skin changes, step-offs, deformities.  Neuro: A&Ox4, CNs II-XII grossly intact. MAEs 5/5 strenght. Sensation grossly intact.  Psych: Normal mood and affect.   ED Results / Procedures / Treatments   Labs (all labs ordered are listed, but only abnormal results are displayed) Labs Reviewed  COMPREHENSIVE METABOLIC PANEL - Abnormal; Notable for the following components:      Result Value   Total Protein 8.3 (*)    AST 13 (*)    All other components within normal limits  CBC WITH DIFFERENTIAL/PLATELET  I-STAT BETA HCG BLOOD, ED ([facility], [facility], [facility] ONLY)    Procedures Procedures    Medications Ordered in ED Medications  HYDROmorphone (DILAUDID) injection 0.5 mg (0.5 mg Intravenous Given 06/23/22 2118)  ondansetron (ZOFRAN) injection 4 mg (4 mg Intravenous Given 06/23/22 2117)  gadobutrol (GADAVIST) 1 MMOL/ML injection 9 mL (9 mLs Intravenous Contrast Given 06/23/22 2327)  sodium chloride 0.9 % bolus 500 mL (0 mLs Intravenous Stopped 06/24/22 0126)    ED Course/ Medical  Decision Making/ A&P                          Medical Decision Making Amount and/or Complexity of Data Reviewed Radiology: ordered.  Risk Prescription drug management.   Patient is vitally stable, afebrile at this time. She is overall nontoxic appearing.    For patient's severe thoracic back pain with radiation and neurologic symptoms of numbness in her R arm, consider possible spinal epidural abscess given recent epidural for her surgery. She has never had anything like this before. Patient does have midline pain, recent epidural, and neurologic symptoms as back pain red flags. Believe patient should be evaluated at this time with an MRI of her spine to rule out this acute process. Nontoxic and no fever, lower c/f transverse myelitis or discitis. No trauma to indicate fractures. Consider disc herniation or spinal stenosis as well. Patient with no abdominal pain, epigastric pain or TTP to indicate pancreatitis, and her pain seems higher than that on her back. No flank pain or CVA tenderness to suggest pyelonephritis and no urinary symptoms/hematuria. Patient with no associated chest or abdominal pain to suggest an aortic pathology and given her midline tenderness over her spinous processes, believe a spinal process is more likely for her. Labs do not demonstrate any leukocytosis or anemia, no electrolyte abnormalities. She is given dialudid for pain control.  I have personally reviewed and interpreted all labs.   Patient is signed out to the oncoming ED physician who is made aware of her history, presentation, exam, workup, and plan.  Plan is to obtain MRI for evaluation. If MRI results okay, patien can likely be discharged home with f/u.            Final Clinical Impression(s) / ED Diagnoses Final diagnoses:  DDD (degenerative disc disease), cervical  Bursitis, unspecified site    Rx / DC Orders ED Discharge Orders     None        This note was created using dictation  software, which may contain spelling or grammatical errors.    Audley Hose, MD 07/03/22 [phone]

## 2022-06-23 NOTE — ED Triage Notes (Signed)
Pt arrived via POV, c/o pain in back where epidural was 10/20 and pain to left arm. States unable to ambulate to baseline due to pain.

## 2022-06-23 NOTE — Telephone Encounter (Signed)
Patient Name: Toni Kennedy Gender: Female DOB: 09/01/1965 Age: 56 Y 97 M 19 D Return Phone Number: [phone] (Primary) Address: City/ State/ Zip: [place]  [address] Client [facility] Client Site [facility] Contact Type Call Who Is Calling Patient / Member / Family / Caregiver Call Type Triage / Clinical Relationship To Patient Self Return Phone Number [phone] (Primary) Chief Complaint Arm Pain (no known cause) Reason for Call Symptomatic / Request for Health Information Initial Comment Caller states [place] with [facility]. She has a pt that needs to talk to a nurse. She had hip replacement on Oct 20th and is having back pain where the epidural was. Now she is having pain radiating in her left arm that is constant and heavy. Additional Comment Caller states there are no available appts for today. Translation No Nurse Assessment Nurse: Ronnell Freshwater, RN, [place] Gave Date/Time Eilene Ghazi Time): 06/23/2022 12:18:20 PM Confirm and document reason for call. If symptomatic, describe symptoms. ---Caller states that she has had a pain developing in her arm and back for a week. Recent hx of epidural for hip replacement, on the 20th. States that she can't sleep because of the pain. She states that hydrocodone doesn't help. Does the patient have any new or worsening symptoms? ---Yes Will a triage be completed? ---Yes Related visit to physician within the last 2 weeks? ---Yes Does the PT have any chronic conditions? (i.e. diabetes, asthma, this includes High risk factors for pregnancy, etc.) ---No Is this a behavioral health or substance abuse call? ---No Guidelines Guideline Title Affirmed Question Affirmed Notes Nurse Date/Time (Eastern Time) Post-Op Symptoms and Questions [1] SEVERE postop pain (e.g., excruciating, pain scale 8-10) AND [2] Ronnell Freshwater, RNHarrell Gave 06/23/2022  12:22:01 PM  Guidelines Guideline Title Affirmed Question Affirmed Notes Nurse Date/Time ([place] Time) not controlled with pain medications Disp. Time Eilene Ghazi Time) Disposition Final User 06/23/2022 12:28:21 PM See HCP within 4 Hours (or PCP triage) Yes Ronnell Freshwater, RN, [place] Gave Final Disposition 06/23/2022 12:28:21 PM See HCP within 4 Hours (or PCP triage) Yes Ronnell Freshwater, RN, [place] Gave Disposition Overriden: Call PCP Now Override Reason: Patient's symptoms need a higher level of care Caller Disagree/Comply Comply Caller Understands Yes PreDisposition Did not know what to do Care Advice Given Per Guideline SEE HCP (OR PCP TRIAGE) WITHIN 4 HOURS: * IF OFFICE WILL BE CLOSED AND NO PCP (PRIMARY CARE PROVIDER) SECOND-LEVEL TRIAGE: You need to be seen within the next 3 or 4 hours. A nearby Urgent Care Center [facility]) is often a good source of care. Another choice is to go to the ED. Go sooner if you become worse. * UCC: Some UCCs can manage patients who are stable and have less serious symptoms (e.g., minor illnesses and injuries). The triager must know the [facility] capabilities before sending a patient there. If unsure, call ahead. CALL BACK IF: * You become worse CARE ADVICE per Post-Op Symptoms and Questions (Adult) guideline. BRING MEDICINES: * Please bring a list of your current medicines when you go to see the doctor. Referrals REFERRED TO PCP OFFICE  [facility]

## 2022-06-23 NOTE — Telephone Encounter (Signed)
  Chief Complaint: Back pain Symptoms: Mid back pain, radiates to left arm, left arm "Achy" 8-9/10. States Orthopedic MD gave Hydrocodone, ineffective.Pt had hip replacement 06/09/22, "Back pain is right where epidural was."  . Frequency: 1 week Pertinent Negatives: Patient denies  Disposition: '[]'$ ED /'[]'$ Urgent Care (no appt availability in office) / '[]'$ Appointment(In office/virtual)/ '[]'$  [facility]/ '[]'$ Home Care/ '[]'$ Refused Recommended Disposition /'[]'$ [facility]/ '[x]'$  Follow-up with PCP Additional Notes:  Advised to alert surgeon and her PCP. Advised ED for worsening symptoms.

## 2022-06-23 NOTE — Telephone Encounter (Signed)
Reason for Disposition  [1] Numbness in an arm or hand (i.e., loss of sensation) AND [2] upper back pain  Answer Assessment - Initial Assessment Questions 1. ONSET: "When did the pain begin?"      1 week ago 2. LOCATION: "Where does it hurt?" (upper, mid or lower back)     Mid back radiates left arm 3. SEVERITY: "How bad is the pain?"  (e.g., Scale 1-10; mild, moderate, or severe)   - MILD (1-3): Doesn't interfere with normal activities.    - MODERATE (4-7): Interferes with normal activities or awakens from sleep.    - SEVERE (8-10): Excruciating pain, unable to do any normal activities.      10/10 usually  8-9/10 presently 4. PATTERN: "Is the pain constant?" (e.g., yes, no; constant, intermittent)      Constant 5. RADIATION: "Does the pain shoot into your legs or somewhere else?"     Back to left arm 6. CAUSE:  "What do you think is causing the back pain?"      "Spot where epidural was." 7. BACK OVERUSE:  "Any recent lifting of heavy objects, strenuous work or exercise?"     no 8. MEDICINES: "What have you taken so far for the pain?" (e.g., nothing, acetaminophen, NSAIDS)     Taking hydrocodone, little relief. 9. NEUROLOGIC SYMPTOMS: "Do you have any weakness, numbness, or problems with bowel/bladder control?"     No 10. OTHER SYMPTOMS: "Do you have any other symptoms?" (e.g., fever, abdomen pain, burning with urination, blood in urine)       no  Protocols used: Back Pain-A-AH

## 2022-06-23 NOTE — ED Provider Notes (Signed)
Patient here today for severe back pain where epidural was placed with surgery 2 weeks ago. She has not slept in 3 days due to pain. She notes she is having pain radiate to her left arm and is having trouble standing straight due to pain. She requests neurontin for pain. Discussed that she would need further evaluation with imaging due to recent surgery- advised further evaluation in the ED.    Francene Finders, PA-C 06/23/22 [phone]

## 2022-06-23 NOTE — ED Notes (Signed)
Patient is being discharged from the Urgent Care and sent to the Emergency Department via POV. Per Ewell Poe, PA, patient is in need of higher level of care due to back pain and arm pain after surgery. Patient is aware and verbalizes understanding of plan of care.  Vitals:   06/23/22 1650  BP: 111/78  Pulse: 68  Resp: 19  Temp: 98.8 F (37.1 C)  SpO2: 95%

## 2022-06-23 NOTE — ED Provider Triage Note (Signed)
Emergency Medicine Provider Triage Evaluation Note  Toni Kennedy , a 56 y.o. female  was evaluated in triage.  Pt complains of burning sensation to mid back onset 2-3 days. Notes that she had an epidural placed on 06/09/2022 and notes that patient has had pain to her left arm. Taken her prescription meds without relief. Denies chest pain, shortness of breath, nausea, vomiting, abdominal pain. Had hip arthroplasty on 06/09/22 with [facility]. Told to come to the ED by UC for further evaluation.   Review of Systems  Positive:  Negative:   Physical Exam  BP (!) 125/95 (BP Location: Right Arm)   Pulse 96   Temp 98.4 F (36.9 C) (Oral)   Resp 18   SpO2 100%  Gen:   Awake, no distress   Resp:  Normal effort  MSK:   Moves extremities without difficulty  Other:  No spinal TTP. No overlying skin changes noted to back.   Medical Decision Making  Medically screening exam initiated at 5:45 PM.  Appropriate orders placed.  Toni Kennedy was informed that the remainder of the evaluation will be completed by another provider, this initial triage assessment does not replace that evaluation, and the importance of remaining in the ED until their evaluation is complete.  5:47 PM - Discussed with RN that patient is in need of a room immediately. RN aware and working on room placement.     Armondo Cech A, PA-C 06/23/22 1747

## 2022-06-23 NOTE — ED Triage Notes (Signed)
Pt had right hip replacement surgery on 10/20. Having back pains where epidural was. Causing pains to left arm. Reports unable to stand up straight or sleep due to pains. Pt taking hydrocodone 7.5 mg that doesn't help. Denies any new falls, or injury.

## 2022-06-24 DIAGNOSIS — M5030 Other cervical disc degeneration, unspecified cervical region: Secondary | ICD-10-CM | POA: Diagnosis not present

## 2022-06-24 MED ORDER — SODIUM CHLORIDE 0.9 % IV BOLUS
500.0000 mL | Freq: Once | INTRAVENOUS | Status: AC
  Administered 2022-06-24: 500 mL via INTRAVENOUS

## 2022-06-30 ENCOUNTER — Other Ambulatory Visit: Payer: Self-pay | Admitting: Physical Medicine and Rehabilitation

## 2022-06-30 DIAGNOSIS — M5010 Cervical disc disorder with radiculopathy, unspecified cervical region: Secondary | ICD-10-CM

## 2022-07-05 ENCOUNTER — Ambulatory Visit
Admission: RE | Admit: 2022-07-05 | Discharge: 2022-07-05 | Disposition: A | Discharge status: Home / Self Care | Payer: BC Managed Care – PPO | Source: Ambulatory Visit | Attending: Physical Medicine and Rehabilitation | Admitting: Physical Medicine and Rehabilitation

## 2022-07-05 DIAGNOSIS — M5010 Cervical disc disorder with radiculopathy, unspecified cervical region: Secondary | ICD-10-CM

## 2022-07-05 MED ORDER — IOPAMIDOL (ISOVUE-M 300) INJECTION 61%
1.0000 mL | Freq: Once | INTRAMUSCULAR | Status: AC | PRN
  Administered 2022-07-05: 1 mL via EPIDURAL

## 2022-07-05 MED ORDER — TRIAMCINOLONE ACETONIDE 40 MG/ML IJ SUSP (RADIOLOGY)
60.0000 mg | Freq: Once | INTRAMUSCULAR | Status: AC
  Administered 2022-07-05: 60 mg via EPIDURAL

## 2022-07-05 NOTE — Discharge Instructions (Signed)

## 2022-07-10 ENCOUNTER — Ambulatory Visit: Payer: BC Managed Care – PPO | Admitting: Family

## 2022-07-10 NOTE — Progress Notes (Deleted)
   Patient ID: Toni Kennedy, female    DOB: Jun 13, 1966, 56 y.o.   MRN: [id number]  No chief complaint on file.   HPI:   Assessment & Plan:   Problem List Items Addressed This Visit   None   Subjective:    Outpatient Medications Prior to Visit  Medication Sig Dispense Refill   ASPIRIN LOW DOSE 81 MG tablet Take 81 mg by mouth daily. (Patient not taking: Reported on 06/23/2022)     diphenhydramine-acetaminophen (TYLENOL PM) 25-500 MG TABS tablet Take 1 tablet by mouth at bedtime as needed (sleep).     HYDROcodone-acetaminophen ([place]) 7.5-325 MG tablet Take 1 tablet by mouth every 4 (four) hours as needed for moderate pain.     HYDROmorphone (DILAUDID) 2 MG tablet Take 2 mg by mouth every 4 (four) hours as needed for moderate pain. (Patient not taking: Reported on 06/23/2022)     oxyCODONE (OXY IR/ROXICODONE) 5 MG immediate release tablet Take 5 mg by mouth every 4 (four) hours as needed for moderate pain. (Patient not taking: Reported on 06/23/2022)     predniSONE (DELTASONE) 5 MG tablet Take 5 mg by mouth. (Patient not taking: Reported on 06/23/2022)     No facility-administered medications prior to visit.   Past Medical History:  Diagnosis Date   Allergy    Anemia    Anxiety    Arthritis    Back complaints    Blood in stool    Cardiac arrhythmia    Depression    Heart murmur    Hemorrhoid    Hypotension    past hx    Scoliosis    Past Surgical History:  Procedure Laterality Date   CESAREAN SECTION     PATELLA FRACTURE SURGERY Left    REPLACEMENT TOTAL HIP W/  RESURFACING IMPLANTS Right    TUBAL LIGATION     Allergies  Allergen Reactions   Sulfa Antibiotics Hives   Sulfur Nausea And Vomiting   Tetracaine Nausea And Vomiting   Tetracyclines & Related Other (See Comments)    sunlight      Objective:    Physical Exam Vitals and nursing note reviewed.  Constitutional:      Appearance: Normal appearance.  Cardiovascular:     Rate and Rhythm: Normal rate and  regular rhythm.  Pulmonary:     Effort: Pulmonary effort is normal.     Breath sounds: Normal breath sounds.  Musculoskeletal:        General: Normal range of motion.  Skin:    General: Skin is warm and dry.  Neurological:     Mental Status: She is alert.  Psychiatric:        Mood and Affect: Mood normal.        Behavior: Behavior normal.    There were no vitals taken for this visit. Wt Readings from Last 3 Encounters:  05/05/22 203 lb 3.2 oz (92.2 kg)  10/06/20 213 lb (96.6 kg)  03/08/18 213 lb (96.6 kg)       Jeanie Sewer, NP

## 2022-08-03 ENCOUNTER — Encounter: Payer: Self-pay | Admitting: *Deleted

## 2022-08-07 ENCOUNTER — Ambulatory Visit (INDEPENDENT_AMBULATORY_CARE_PROVIDER_SITE_OTHER): Payer: BC Managed Care – PPO | Admitting: Family

## 2022-08-07 ENCOUNTER — Encounter: Payer: Self-pay | Admitting: Family

## 2022-08-07 VITALS — BP 128/84 | HR 60 | Temp 98.0°F | Ht 64.0 in | Wt 204.4 lb

## 2022-08-07 DIAGNOSIS — H538 Other visual disturbances: Secondary | ICD-10-CM | POA: Diagnosis not present

## 2022-08-07 DIAGNOSIS — Z1211 Encounter for screening for malignant neoplasm of colon: Secondary | ICD-10-CM

## 2022-08-07 DIAGNOSIS — M542 Cervicalgia: Secondary | ICD-10-CM | POA: Diagnosis not present

## 2022-08-07 DIAGNOSIS — Z1231 Encounter for screening mammogram for malignant neoplasm of breast: Secondary | ICD-10-CM

## 2022-08-07 DIAGNOSIS — Z0000 Encounter for general adult medical examination without abnormal findings: Secondary | ICD-10-CM

## 2022-08-07 DIAGNOSIS — E782 Mixed hyperlipidemia: Secondary | ICD-10-CM

## 2022-08-07 LAB — LIPID PANEL
Cholesterol: 276 mg/dL — ABNORMAL HIGH (ref 0–200)
HDL: 71.4 mg/dL (ref >39.00)
LDL Cholesterol: 175 mg/dL — ABNORMAL HIGH (ref 0–99)
NonHDL: 204.29
Total CHOL/HDL Ratio: 4
Triglycerides: 146 mg/dL (ref 0.0–149.0)
VLDL: 29.2 mg/dL (ref 0.0–40.0)

## 2022-08-07 LAB — TSH: TSH: 2.03 u[IU]/mL (ref 0.35–5.50)

## 2022-08-07 MED ORDER — GABAPENTIN 100 MG PO CAPS: 100.0000 mg | ORAL_CAPSULE | Freq: Three times a day (TID) | ORAL | 1 refills | Status: DC

## 2022-08-07 NOTE — Assessment & Plan Note (Signed)
new pain after recent hip surgery seen by Cassie Freer given steroid inj 1 mo ago, helped neck, but having left hand pain still denies numbness or tingling, more of a dull, burning pain pt requesting Hydrocodone as she had been on monthly w/pain clinic but can no longer afford to go advised I do not do chronic pain mgt & she needs to f/u with ORTHO re steroid inj sending Gabapentin '100mg'$  tid prn f/u prn

## 2022-08-07 NOTE — Patient Instructions (Addendum)
It was very nice to see you today!   I will review your lab results via MyChart in a few days.   Have a wonderful Christmas!    PLEASE NOTE:  If you had any lab tests please let us know if you have not heard back within a few days. You may see your results on MyChart before we have a chance to review them but we will give you a call once they are reviewed by [place]. If we ordered any referrals today, please let us know if you have not heard from their office within the next week.

## 2022-08-07 NOTE — Progress Notes (Signed)
Phone [phone]  Subjective:   Patient is a 56 y.o. female presenting for annual physical.    Chief Complaint  Patient presents with   Annual Exam    Fasting w/ Labs   Hand Pain    Pt c/o Left hand pain that goes up her left arm. Present for a couple of months. Has tried tylenol and was given an injection. Pt is not able to see pain management.    HPI: Left hand pain/Neck pain:  started after having epidural for hip surgery, pain starts in neck and radiates down her left arm. She had a steroid injection in her neck a month ago, which has helped her neck pain, but not the pain in her hand. Pt was seeing [facility], and they ordered her injection, she is needing to go back to them. pt also reports she has outstanding debt with the pain clinic and can not afford to go back to them, requesting opioid pain med for her chronic pain.  See problem oriented charting- ROS- full  review of systems was completed and negative except for: hand pain noted in HPI above.  The following were reviewed and entered/updated in epic: Past Medical History:  Diagnosis Date   Allergy    Anemia    Anxiety    Arthritis    Back complaints    Blood in stool    Cardiac arrhythmia    Depression    Heart murmur    Hemorrhoid    Hypotension    past hx    Scoliosis    Patient Active Problem List   Diagnosis Date Noted   Cervicalgia 08/07/2022   Chronic bilateral low back pain without sciatica 11/15/2017   Encounter for screening colonoscopy 03/27/2017   Rectal bleeding 03/27/2017   External hemorrhoid 03/27/2017   Routine adult health maintenance 03/14/2017   Obesity 03/14/2017   Chondromalacia of left patellofemoral joint 02/16/2015   Tibialis posterior tendinitis 02/16/2015   Past Surgical History:  Procedure Laterality Date   CESAREAN SECTION     PATELLA FRACTURE SURGERY Left    REPLACEMENT TOTAL HIP W/  RESURFACING IMPLANTS Right    TUBAL LIGATION      Family History  Problem  Relation Age of Onset   Arthritis Mother    Hyperlipidemia Mother    Hypertension Mother    Diabetes Mother    Heart disease Father    Heart disease Maternal Grandmother    Colon cancer Neg Hx    Colon polyps Neg Hx    Esophageal cancer Neg Hx    Rectal cancer Neg Hx    Stomach cancer Neg Hx     Medications- reviewed and updated Current Outpatient Medications  Medication Sig Dispense Refill   diphenhydramine-acetaminophen (TYLENOL PM) 25-500 MG TABS tablet Take 1 tablet by mouth at bedtime as needed (sleep).     gabapentin (NEURONTIN) 100 MG capsule Take 1 capsule (100 mg total) by mouth 3 (three) times daily. 90 capsule 1   No current facility-administered medications for this visit.    Allergies-reviewed and updated Allergies  Allergen Reactions   Sulfa Antibiotics Hives   Sulfur Nausea And Vomiting   Tetracaine Nausea And Vomiting   Tetracyclines & Related Other (See Comments)    sunlight    Social History   Social History Narrative   Fun/Hobby: Play sweepstakes   Denies abuse and feels safe at home.     Objective:  BP 128/84 (BP Location: Left Arm, Patient Position: Sitting, Cuff Size:  Large)   Pulse 60   Temp 98 F (36.7 C) (Temporal)   Ht '5\' 4"'$  (1.626 m)   Wt 204 lb 6 oz (92.7 kg)   SpO2 97%   BMI 35.08 kg/m  Physical Exam Vitals and nursing note reviewed.  Constitutional:      Appearance: Normal appearance. She is obese.  HENT:     Head: Normocephalic.     Right Ear: Tympanic membrane normal.     Left Ear: Tympanic membrane normal.     Nose: Nose normal.     Mouth/Throat:     Mouth: Mucous membranes are moist.  Eyes:     Pupils: Pupils are equal, round, and reactive to light.  Cardiovascular:     Rate and Rhythm: Normal rate and regular rhythm.  Pulmonary:     Effort: Pulmonary effort is normal.     Breath sounds: Normal breath sounds.  Musculoskeletal:        General: Normal range of motion.     Cervical back: Normal range of motion.   Lymphadenopathy:     Cervical: No cervical adenopathy.  Skin:    General: Skin is warm and dry.  Neurological:     Mental Status: She is alert.  Psychiatric:        Mood and Affect: Mood normal.        Behavior: Behavior normal.      Assessment and Plan   Health Maintenance counseling: 1. Anticipatory guidance: Patient counseled regarding regular dental exams q6 months, eye exams,  avoiding smoking and second hand smoke, limiting alcohol to 1 beverage per day, no illicit drugs.   2. Risk factor reduction:  Advised patient of need for regular exercise and diet rich with fruits and vegetables to reduce risk of heart attack and stroke. Exercise- none, due to pain.  Wt Readings from Last 3 Encounters:  08/07/22 204 lb 6 oz (92.7 kg)  05/05/22 203 lb 3.2 oz (92.2 kg)  10/06/20 213 lb (96.6 kg)   3. Immunizations/screenings/ancillary studies  There is no immunization history on file for this patient. Health Maintenance Due  Topic Date Due   COVID-19 Vaccine (1) Never done   FOOT EXAM  Never done   HIV Screening  Never done   Diabetic kidney evaluation - Urine ACR  Never done   Hepatitis C Screening  Never done   DTaP/Tdap/Td (1 - Tdap) Never done   PAP SMEAR-Modifier  Never done   MAMMOGRAM  Never done   HEMOGLOBIN A1C  03/31/2018    4. Cervical cancer screening- last year 5. Breast cancer screening-  mammogram due, ordering today 6. Colon cancer screening - due, sending order today 7. Skin cancer screening- advised regular sunscreen use. Denies worrisome, changing, or new skin lesions.  8. Birth control/STD check- N/A  9. Osteoporosis screening- N/A  10. Alcohol screening: socially 11. Smoking associated screening (lung cancer screening, AAA screen 65-75, UA)- current smoker, cigars  Problem List Items Addressed This Visit       Other   Cervicalgia    new pain after recent hip surgery seen by Cassie Freer given steroid inj 1 mo ago, helped neck, but having left  hand pain still denies numbness or tingling, more of a dull, burning pain pt requesting Hydrocodone as she had been on monthly w/pain clinic but can no longer afford to go advised I do not do chronic pain mgt & she needs to f/u with ORTHO re steroid inj sending Gabapentin '100mg'$  tid prn  f/u prn      Relevant Medications   gabapentin (NEURONTIN) 100 MG capsule   Other Visit Diagnoses     Annual physical exam    -  Primary   Relevant Orders   TSH (Completed)   Lipid panel (Completed)   Screening for colon cancer       Relevant Orders   Ambulatory referral to Gastroenterology   Screening mammogram for breast cancer       Relevant Orders   MM Digital Screening   Blurry vision, bilateral       Relevant Orders   Ambulatory referral to Ophthalmology      Recommended follow up: No follow-ups on file. No future appointments.  Lab/Order associations:fasting   Jeanie Sewer, NP

## 2022-08-11 NOTE — Progress Notes (Signed)
Your thyroid function is normal.  Your total cholesterol number & LDL (bad #) are very high. And along with your smoking and weight it puts you at higher risk for heart attack of stroke (>15%). So it is recommended to start a medication to help lower your cholesterol. I usually start with a low dose of Crestor a few days a week and slowly increase to daily.  Let me know if you are agreeable to start this.  You also need to reduce any fried foods, alcohol, nonnutritional snacks e.g. chips/cookies,pies, cakes and candies, fatty meat (red meat), high fat dairy foods:  including cheese, milk, ice cream.  Increase intake of whole foods:  fresh vegetables, fruit, & fiber.   Continue or restart an exercise routine, shooting for 55mn 5-7days per week.   Recommend repeating fasting lipids (no food or drink except black coffee or water after midnight) in 3-6 months with an office visit, can call & schedule today.

## 2022-08-24 MED ORDER — ROSUVASTATIN CALCIUM 5 MG PO TABS: 5.0000 mg | ORAL_TABLET | Freq: Every day | ORAL | 2 refills | Status: DC

## 2022-08-24 NOTE — Progress Notes (Signed)
medication sent, she needs to schedule office visit with fasting labs for 2-3 months. thx

## 2022-08-24 NOTE — Addendum Note (Signed)
Addended byJeanie Sewer on: 08/24/2022 12:43 PM   Modules accepted: Orders

## 2022-09-22 ENCOUNTER — Ambulatory Visit (AMBULATORY_SURGERY_CENTER): Payer: BC Managed Care – PPO

## 2022-09-22 VITALS — Ht 64.0 in | Wt 205.0 lb

## 2022-09-22 DIAGNOSIS — Z86010 Personal history of colonic polyps: Secondary | ICD-10-CM

## 2022-09-22 MED ORDER — NA SULFATE-K SULFATE-MG SULF 17.5-3.13-1.6 GM/177ML PO SOLN: 1.0000 | Freq: Once | ORAL | 0 refills | Status: AC

## 2022-09-22 NOTE — Progress Notes (Signed)
Pre visit completed via phone call; Patient verified name, DOB, and address;  No egg or soy allergy known to patient  No issues known to pt with past sedation with any surgeries or procedures Patient denies ever being told they had issues or difficulty with intubation  No FH of Malignant Hyperthermia Pt is not on diet pills Pt is not on home 02  Pt is not on blood thinners  Pt reports issues with constipation - patient reports she has to use an enema maybe twice a year to help with constipation; increases fluid intake; patient advised to increase fluid intake, activity, and fruits/veggies;  No A fib or A flutter Have any cardiac testing pending--NO Pt instructed to use Singlecare.com or GoodRx for a price reduction on prep   Insurance verified during [place] appt=[employer] [place]  Patient's chart reviewed by Osvaldo Angst CNRA prior to previsit and patient appropriate for the [facility].  Previsit completed and red dot placed by patient's name on their procedure day (on provider's schedule).

## 2022-10-02 ENCOUNTER — Encounter: Payer: Self-pay | Admitting: Gastroenterology

## 2022-10-13 ENCOUNTER — Ambulatory Visit (AMBULATORY_SURGERY_CENTER): Payer: BC Managed Care – PPO | Admitting: Gastroenterology

## 2022-10-13 ENCOUNTER — Encounter: Payer: Self-pay | Admitting: Gastroenterology

## 2022-10-13 VITALS — BP 122/77 | HR 52 | Temp 96.2°F | Resp 9 | Ht 64.0 in | Wt 205.0 lb

## 2022-10-13 DIAGNOSIS — Z86010 Personal history of colonic polyps: Secondary | ICD-10-CM | POA: Diagnosis not present

## 2022-10-13 DIAGNOSIS — Z09 Encounter for follow-up examination after completed treatment for conditions other than malignant neoplasm: Secondary | ICD-10-CM

## 2022-10-13 DIAGNOSIS — D122 Benign neoplasm of ascending colon: Secondary | ICD-10-CM

## 2022-10-13 DIAGNOSIS — D125 Benign neoplasm of sigmoid colon: Secondary | ICD-10-CM

## 2022-10-13 DIAGNOSIS — K635 Polyp of colon: Secondary | ICD-10-CM

## 2022-10-13 MED ORDER — SODIUM CHLORIDE 0.9 % IV SOLN: 500.0000 mL | Freq: Once | INTRAVENOUS | Status: DC

## 2022-10-13 NOTE — Progress Notes (Signed)
Vss nad trans to pacu bovie pad site clean no burns

## 2022-10-13 NOTE — Progress Notes (Unsigned)
[facility] History and Physical   Primary Care Physician:  Jeanie Sewer, NP   Reason for Procedure:  History of adenomatous colon polyps  Plan:    Surveillance colonoscopy with possible interventions as needed     HPI: Toni Kennedy is a very pleasant 57 y.o. female here for surveillance colonoscopy. Denies any nausea, vomiting, abdominal pain, melena or bright red blood per rectum  The risks and benefits as well as alternatives of endoscopic procedure(s) have been discussed and reviewed. All questions answered. The patient agrees to proceed.    Past Medical History:  Diagnosis Date   Anemia    hx of   Anxiety    Arthritis    generalized   Back complaints    Blood in stool    Cardiac arrhythmia    Depression    PTSD-not on medications   GERD (gastroesophageal reflux disease)    with certain foods   Heart murmur    Hemorrhoid    Hyperlipidemia    on meds   Hypotension    past hx    Neuromuscular disorder (HCC)    Osteoporosis    Scoliosis    Seasonal allergies    Substance abuse ([place])     Past Surgical History:  Procedure Laterality Date   CESAREAN SECTION     COLONOSCOPY  2018   PATELLA FRACTURE SURGERY Left    POLYPECTOMY  2018   SSP   REPLACEMENT TOTAL HIP W/  RESURFACING IMPLANTS Right    TUBAL LIGATION     WISDOM TOOTH EXTRACTION      Prior to Admission medications   Medication Sig Start Date End Date Taking? Authorizing Provider  diphenhydramine-acetaminophen (TYLENOL PM) 25-500 MG TABS tablet Take 1 tablet by mouth at bedtime as needed (sleep).   Yes [provider]  celecoxib (CELEBREX) 200 MG capsule Take 200 mg by mouth 2 (two) times daily.    [provider]  rosuvastatin (CRESTOR) 5 MG tablet Take 1 tablet (5 mg total) by mouth daily. START taking 1 pill on Monday, Wednesday, Friday for 2 weeks, then every other day for 2 weeks, then qd 08/24/22   Jeanie Sewer, NP    Current Outpatient  Medications  Medication Sig Dispense Refill   diphenhydramine-acetaminophen (TYLENOL PM) 25-500 MG TABS tablet Take 1 tablet by mouth at bedtime as needed (sleep).     celecoxib (CELEBREX) 200 MG capsule Take 200 mg by mouth 2 (two) times daily.     rosuvastatin (CRESTOR) 5 MG tablet Take 1 tablet (5 mg total) by mouth daily. START taking 1 pill on Monday, Wednesday, Friday for 2 weeks, then every other day for 2 weeks, then qd 30 tablet 2   Current Facility-Administered Medications  Medication Dose Route Frequency Provider Last Rate Last Admin   0.9 %  sodium chloride infusion  500 mL Intravenous Once Mauri Pole, MD        Allergies as of 10/13/2022 - Review Complete 10/13/2022  Allergen Reaction Noted   Sulfa antibiotics Hives 06/17/2013   Sulfur Nausea And Vomiting 05/05/2022   Tetracaine Nausea And Vomiting 05/05/2022   Tetracyclines & related Other (See Comments) 06/17/2013    Family History  Problem Relation Age of Onset   Arthritis Mother    Hyperlipidemia Mother    Hypertension Mother    Diabetes Mother    Heart disease Father    Liver cancer Brother 71   Heart disease Maternal Grandmother    Colon cancer Neg Hx  Colon polyps Neg Hx    Esophageal cancer Neg Hx    Rectal cancer Neg Hx    Stomach cancer Neg Hx     Social History   Socioeconomic History   Marital status: Divorced    Spouse name: Not on file   Number of children: 2   Years of education: 14   Highest education level: Not on file  Occupational History   Occupation: Designer, industrial/product  Tobacco Use   Smoking status: Some Days    Types: Cigars   Smokeless tobacco: Never  Vaping Use   Vaping Use: Never used  Substance and Sexual Activity   Alcohol use: Never   Drug use: Not on file    Comment: pt denies any substance abuse   Sexual activity: Yes    Partners: Male    Comment: 1st intercourse- 18, partners- 21, current partner- 1 yr  Other Topics Concern   Not on file  Social  History Narrative   Fun/Hobby: Play sweepstakes   Denies abuse and feels safe at home.    Social Determinants of Health   Financial Resource Strain: Not on file  Food Insecurity: Not on file  Transportation Needs: Not on file  Physical Activity: Not on file  Stress: Not on file  Social Connections: Not on file  Intimate Partner Violence: Not on file    Review of Systems:  All other review of systems negative except as mentioned in the HPI.  Physical Exam: Vital signs in last 24 hours: Blood Pressure 105/62 (BP Location: Right Arm, Patient Position: Sitting, Cuff Size: Normal)   Pulse 61   Temperature (Abnormal) 96.2 F (35.7 C) (Temporal)   Height '5\' 4"'$  (1.626 m)   Weight 205 lb (93 kg)   Oxygen Saturation 96%   Body Mass Index 35.19 kg/m  General:   Alert, NAD Lungs:  Clear .   Heart:  Regular rate and rhythm Abdomen:  Soft, nontender and nondistended. Neuro/Psych:  Alert and cooperative. Normal mood and affect. A and O x 3  Reviewed labs, radiology imaging, old records and pertinent past GI work up  Patient is appropriate for planned procedure(s) and anesthesia in an ambulatory setting   K. Denzil Magnuson , MD [phone]

## 2022-10-13 NOTE — Patient Instructions (Signed)
YOU HAD AN ENDOSCOPIC PROCEDURE TODAY AT [facility]:   Refer to the procedure report that was given to you for any specific questions about what was found during the examination.  If the procedure report does not answer your questions, please call your gastroenterologist to clarify.  If you requested that your care partner not be given the details of your procedure findings, then the procedure report has been included in a sealed envelope for you to review at your convenience later.  **Handouts given on polyps, hemorrhoids and diverticulosis**  YOU SHOULD EXPECT: Some feelings of bloating in the abdomen. Passage of more gas than usual.  Walking can help get rid of the air that was put into your GI tract during the procedure and reduce the bloating. If you had a lower endoscopy (such as a colonoscopy or flexible sigmoidoscopy) you may notice spotting of blood in your stool or on the toilet paper. If you underwent a bowel prep for your procedure, you may not have a normal bowel movement for a few days.  Please Note:  You might notice some irritation and congestion in your nose or some drainage.  This is from the oxygen used during your procedure.  There is no need for concern and it should clear up in a day or so.  SYMPTOMS TO REPORT IMMEDIATELY:  Following lower endoscopy (colonoscopy or flexible sigmoidoscopy):  Excessive amounts of blood in the stool  Significant tenderness or worsening of abdominal pains  Swelling of the abdomen that is new, acute  Fever of 100F or higher  For urgent or emergent issues, a gastroenterologist can be reached at any hour by calling [phone]. Do not use MyChart messaging for urgent concerns.    DIET:  We do recommend a small meal at first, but then you may proceed to your regular diet.  Drink plenty of fluids but you should avoid alcoholic beverages for 24 hours.  ACTIVITY:  You should plan to take it easy for the rest of today and you  should NOT DRIVE or use heavy machinery until tomorrow (because of the sedation medicines used during the test).    FOLLOW UP: Our staff will call the number listed on your records the next business day following your procedure.  We will call around 7:15- 8:00 am to check on you and address any questions or concerns that you may have regarding the information given to you following your procedure. If we do not reach you, we will leave a message.     If any biopsies were taken you will be contacted by phone or by letter within the next 1-3 weeks.  Please call us at [phone] if you have not heard about the biopsies in 3 weeks.    SIGNATURES/CONFIDENTIALITY: You and/or your care partner have signed paperwork which will be entered into your electronic medical record.  These signatures attest to the fact that that the information above on your After Visit Summary has been reviewed and is understood.  Full responsibility of the confidentiality of this discharge information lies with you and/or your care-partner.

## 2022-10-13 NOTE — Op Note (Signed)
[facility] Patient Name: Toni Kennedy Procedure Date: 10/13/2022 1:25 PM MRN: [id number] Endoscopist: Mauri Pole , MD, [id number] Age: 57 Referring MD:  Date of Birth: 11/02/1965 Gender: Female Account #: [id number] Procedure:                Colonoscopy Indications:              High risk colon cancer surveillance: Personal                            history of colonic polyps, High risk colon cancer                            surveillance: Personal history of adenoma (10 mm or                            greater in size), High risk colon cancer                            surveillance: Personal history of multiple (3 or                            more) adenomas, High risk colon cancer                            surveillance: Personal history of sessile serrated                            colon polyp (10 mm or greater in size) Medicines:                Monitored Anesthesia Care Procedure:                Pre-Anesthesia Assessment:                           - Prior to the procedure, a History and Physical                            was performed, and patient medications and                            allergies were reviewed. The patient's tolerance of                            previous anesthesia was also reviewed. The risks                            and benefits of the procedure and the sedation                            options and risks were discussed with the patient.                            All questions were answered, and informed consent  was obtained. Prior Anticoagulants: The patient has                            taken no anticoagulant or antiplatelet agents. ASA                            Grade Assessment: II - A patient with mild systemic                            disease. After reviewing the risks and benefits,                            the patient was deemed in satisfactory condition to                            undergo  the procedure.                           After obtaining informed consent, the colonoscope                            was passed under direct vision. Throughout the                            procedure, the patient's blood pressure, pulse, and                            oxygen saturations were monitored continuously. The                            Olympus PCF-H190DL [id number]) Colonoscope was                            introduced through the anus and advanced to the the                            cecum, identified by appendiceal orifice and                            ileocecal valve. The colonoscopy was performed                            without difficulty. The patient tolerated the                            procedure well. The quality of the bowel                            preparation was good. The ileocecal valve,                            appendiceal orifice, and rectum were photographed. Scope In: 1:33:43 PM Scope Out: 1:44:31 PM Scope Withdrawal Time: 0 hours 7 minutes 50 seconds  Total Procedure Duration: 0 hours  10 minutes 48 seconds  Findings:                 The perianal and digital rectal examinations were                            normal.                           A 4 mm polyp was found in the ascending colon. The                            polyp was sessile. The polyp was removed with a                            cold snare. Resection and retrieval were complete.                           A 10 mm polyp was found in the sigmoid colon. The                            polyp was semi-pedunculated. The polyp was removed                            with a hot snare. Resection and retrieval were                            complete.                           Scattered large-mouthed, medium-mouthed and                            small-mouthed diverticula were found in the sigmoid                            colon, descending colon, transverse colon and                             ascending colon. There was evidence of diverticular                            spasm. Peri-diverticular erythema was seen. There                            was evidence of an impacted diverticulum. There was                            no evidence of diverticular bleeding.                           Non-bleeding external and internal hemorrhoids were                            found during retroflexion. The hemorrhoids were  medium-sized. Complications:            No immediate complications. Estimated Blood Loss:     Estimated blood loss was minimal. Impression:               - One 4 mm polyp in the ascending colon, removed                            with a cold snare. Resected and retrieved.                           - One 10 mm polyp in the sigmoid colon, removed                            with a hot snare. Resected and retrieved.                           - Moderate diverticulosis in the sigmoid colon, in                            the descending colon, in the transverse colon and                            in the ascending colon. There was evidence of                            diverticular spasm. Peri-diverticular erythema was                            seen. There was evidence of an impacted                            diverticulum. There was no evidence of diverticular                            bleeding.                           - Non-bleeding external and internal hemorrhoids. Recommendation:           - Patient has a contact number available for                            emergencies. The signs and symptoms of potential                            delayed complications were discussed with the                            patient. Return to normal activities tomorrow.                            Written discharge instructions were provided to the  patient.                           - Resume previous diet.                           -  Continue present medications.                           - Await pathology results.                           - Repeat colonoscopy in 3 - 5 years for                            surveillance based on pathology results. Mauri Pole, MD 10/13/2022 1:53:43 PM This report has been signed electronically.

## 2022-10-13 NOTE — Progress Notes (Unsigned)
Vitals-DT  Pt's states no medical or surgical changes since previsit or office visit.  

## 2022-10-13 NOTE — Progress Notes (Signed)
Called to room to assist during endoscopic procedure.  Patient ID and intended procedure confirmed with present staff. Received instructions for my participation in the procedure from the performing physician.  

## 2022-10-14 ENCOUNTER — Encounter: Payer: Self-pay | Admitting: Gastroenterology

## 2022-10-16 ENCOUNTER — Ambulatory Visit
Admission: RE | Admit: 2022-10-16 | Discharge: 2022-10-16 | Disposition: A | Discharge status: Home / Self Care | Payer: BC Managed Care – PPO | Source: Ambulatory Visit | Attending: Family | Admitting: Family

## 2022-10-16 ENCOUNTER — Telehealth: Payer: Self-pay

## 2022-10-16 DIAGNOSIS — Z1231 Encounter for screening mammogram for malignant neoplasm of breast: Secondary | ICD-10-CM

## 2022-10-16 NOTE — Telephone Encounter (Signed)
  Follow up Call-     10/13/2022   12:41 PM 10/13/2022   12:37 PM  Call back number  Post procedure Call Back phone  # [phone]   Permission to leave phone message  Yes     Patient questions:  Do you have a fever, pain , or abdominal swelling? No. Pain Score  0 *  Have you tolerated food without any problems? Yes.    Have you been able to return to your normal activities? Yes.    Do you have any questions about your discharge instructions: Diet   No. Medications  No. Follow up visit  No.  Do you have questions or concerns about your Care? No.  Actions: * If pain score is 4 or above: No action needed, pain <4.

## 2022-10-19 ENCOUNTER — Encounter: Payer: Self-pay | Admitting: Gastroenterology

## 2022-11-01 ENCOUNTER — Encounter: Payer: Self-pay | Admitting: Gastroenterology

## 2023-03-13 ENCOUNTER — Encounter: Payer: Self-pay | Admitting: Family

## 2023-03-13 ENCOUNTER — Ambulatory Visit (INDEPENDENT_AMBULATORY_CARE_PROVIDER_SITE_OTHER): Payer: BC Managed Care – PPO | Admitting: Family

## 2023-03-13 VITALS — BP 131/87 | HR 57 | Temp 98.0°F | Ht 64.0 in | Wt 211.2 lb

## 2023-03-13 DIAGNOSIS — M79672 Pain in left foot: Secondary | ICD-10-CM

## 2023-03-13 DIAGNOSIS — R7303 Prediabetes: Secondary | ICD-10-CM | POA: Diagnosis not present

## 2023-03-13 DIAGNOSIS — M79671 Pain in right foot: Secondary | ICD-10-CM

## 2023-03-13 DIAGNOSIS — E782 Mixed hyperlipidemia: Secondary | ICD-10-CM | POA: Diagnosis not present

## 2023-03-13 DIAGNOSIS — G4709 Other insomnia: Secondary | ICD-10-CM | POA: Diagnosis not present

## 2023-03-13 LAB — LIPID PANEL
Cholesterol: 258 mg/dL — ABNORMAL HIGH (ref 0–200)
HDL: 57.5 mg/dL (ref >39.00)
NonHDL: 200.46
Total CHOL/HDL Ratio: 4
Triglycerides: 210 mg/dL — ABNORMAL HIGH (ref 0.0–149.0)
VLDL: 42 mg/dL — ABNORMAL HIGH (ref 0.0–40.0)

## 2023-03-13 LAB — HEMOGLOBIN A1C: Hgb A1c MFr Bld: 6.4 % (ref 4.6–6.5)

## 2023-03-13 LAB — LDL CHOLESTEROL, DIRECT: Direct LDL: 170 mg/dL

## 2023-03-13 MED ORDER — TRAZODONE HCL 50 MG PO TABS: 25.0000 mg | ORAL_TABLET | Freq: Every evening | ORAL | 3 refills | Status: DC | PRN

## 2023-03-13 MED ORDER — ROSUVASTATIN CALCIUM 5 MG PO TABS: 5.0000 mg | ORAL_TABLET | Freq: Every day | ORAL | 1 refills | Status: DC

## 2023-03-13 NOTE — Progress Notes (Signed)
Patient ID: Toni Kennedy, female    DOB: 1966-08-11, 57 y.o.   MRN: [id number]  Chief Complaint  Patient presents with   Insomnia    Pt c/ insomnia and feet feeling heavy for about 2-3 weeks, Has tried elevating her feet at night.    Prediabetes    Pt is fasting and would like a A1C.    Hyperlipidemia    not-fasting    HPI: Bilateral foot pain:  pain on bottom of feet, feels as soon as she steps out of bed in am and sometimes eases off the rest of the day. Describes as a throbbing pain, not burning, & denies tingling or numbness.  Reports having plantar fasciitis in past in right foot, and received injections for, but this pain feels differently. Pt wears flats often, has mostly flat, no arch in both feet. She is concerned as she just accepted a job with the [employer] and will be on her feet 12h per day.  Sleep problem:  has taken Tylenol PM every night for a lot of years and worried about effects on her liver. She also sometimes wakes up during the night. She is getting used to living by herself, and sometimes hears things or just gets nervous about someone breaking in. States she has tried Melatonin which didn't help. Has never been on RX med before. Prediabetes: Pt reports she was told in past she had prediabetes, not placed on med, advised on low carb diet. In EMR last reading was 6.5. Denies polyuria/polydipsia/polyphagia; Denies neuropathy, but having new bilateral foot pain. Home glucose readings range: not checking. Last A1C was  Lab Results  Component Value Date   HGBA1C 6.5 10/01/2017   Hyperlipidemia: Patient is currently maintained on the following medication for hyperlipidemia: Crestor, qod. Patient denies myalgia or other side effects. Patient reports good compliance with low fat/low cholesterol diet.  Last lipid panel as follows: Lab Results  Component Value Date   CHOL 276 (H) 08/07/2022   HDL 71.40 08/07/2022   LDLCALC 175 (H) 08/07/2022   TRIG 146.0  08/07/2022   CHOLHDL 4 08/07/2022    Assessment & Plan:  Mixed hyperlipidemia Assessment & Plan: chronic, unstable TC 276, LDL 176 - CVD risk 18% started on Crestor in Jan with titration up, pt did not increase frequency, taking just 3d/week rechecking lipids today advised on taking med daily, sending refill f/u 3-6 mos  Orders: -     Rosuvastatin Calcium; Take 1 tablet (5 mg total) by mouth daily. START taking 1 pill on Monday, Wednesday, Friday for 2 weeks, then every other day for 2 weeks, then qd  Dispense: 90 tablet; Refill: 1 -     Lipid panel  Prediabetes Assessment & Plan: chronic pt has had a 7lbweight gain since last visit last A1C 6.5 advised on low carb diet, increasing exercise, portion control if A1C still elevated, can discuss GLP-1 f/u 1-57mos.  Orders: -     Hemoglobin A1c  Other insomnia Assessment & Plan: chronic, never evaluated in past has take Tylenol PM for many years advised on sleep hygiene measures including adding a sound machine or fan to prevent outside noise awakenings starting Trazodone 25mg , increase to 50mg  if not working, advised on use & SE stop taking Tylenol PM f/u 1-104mos  Orders: -     traZODone HCl; Take 0.5-1 tablets (25-50 mg total) by mouth at bedtime as needed for sleep.  Dispense: 30 tablet; Refill: 3  Bilateral foot pain- pt concerned  also pain r/t T2DM, no recheck on A1C since 2019 and 6.5 at that time. Referring to podiatry for eval & custom insoles.  -     Ambulatory referral to Podiatry  Subjective:    Outpatient Medications Prior to Visit  Medication Sig Dispense Refill   celecoxib (CELEBREX) 200 MG capsule Take 200 mg by mouth 2 (two) times daily.     diphenhydramine-acetaminophen (TYLENOL PM) 25-500 MG TABS tablet Take 1 tablet by mouth at bedtime as needed (sleep).     rosuvastatin (CRESTOR) 5 MG tablet Take 1 tablet (5 mg total) by mouth daily. START taking 1 pill on Monday, Wednesday, Friday for 2 weeks, then  every other day for 2 weeks, then qd 30 tablet 2   No facility-administered medications prior to visit.   Past Medical History:  Diagnosis Date   Anemia    hx of   Anxiety    Arthritis    generalized   Back complaints    Blood in stool    Cardiac arrhythmia    Depression    PTSD-not on medications   Encounter for screening colonoscopy 03/27/2017   GERD (gastroesophageal reflux disease)    with certain foods   Heart murmur    Hemorrhoid    Hyperlipidemia    on meds   Hypotension    past hx    Neuromuscular disorder (HCC)    Osteoporosis    Scoliosis    Seasonal allergies    Substance abuse (HCC)    Past Surgical History:  Procedure Laterality Date   CESAREAN SECTION     COLONOSCOPY  2018   PATELLA FRACTURE SURGERY Left    POLYPECTOMY  2018   SSP   REPLACEMENT TOTAL HIP W/  RESURFACING IMPLANTS Right    TUBAL LIGATION     WISDOM TOOTH EXTRACTION     Allergies  Allergen Reactions   Sulfa Antibiotics Hives   Sulfur Nausea And Vomiting   Tetracaine Nausea And Vomiting   Tetracyclines & Related Other (See Comments)    sunlight      Objective:    Physical Exam Vitals and nursing note reviewed.  Constitutional:      Appearance: Normal appearance.  Cardiovascular:     Rate and Rhythm: Normal rate and regular rhythm.  Pulmonary:     Effort: Pulmonary effort is normal.     Breath sounds: Normal breath sounds.  Musculoskeletal:     Right foot: Normal range of motion.     Left foot: Normal range of motion.  Feet:     Right foot:     Skin integrity: Skin integrity normal.     Toenail Condition: Right toenails are normal.     Left foot:     Skin integrity: Skin integrity normal.     Toenail Condition: Left toenails are normal.  Skin:    General: Skin is warm and dry.  Neurological:     Mental Status: She is alert.  Psychiatric:        Mood and Affect: Mood normal.        Behavior: Behavior normal.    BP 131/87   Pulse (!) 57   Temp 98 F (36.7 C)  (Temporal)   Ht 5\' 4"  (1.626 m)   Wt 211 lb 3.2 oz (95.8 kg)   SpO2 94%   BMI 36.25 kg/m  Wt Readings from Last 3 Encounters:  03/13/23 211 lb 3.2 oz (95.8 kg)  10/13/22 205 lb (93 kg)  09/22/22 205 lb (  93 kg)       Dulce Sellar, NP

## 2023-03-13 NOTE — Assessment & Plan Note (Signed)
chronic, never evaluated in past has take Tylenol PM for many years advised on sleep hygiene measures including adding a sound machine or fan to prevent outside noise awakenings starting Trazodone 25mg , increase to 50mg  if not working, advised on use & SE stop taking Tylenol PM f/u 1-69mos

## 2023-03-13 NOTE — Patient Instructions (Signed)
It was very nice to see you today!   I will review your lab results via MyChart in a few days.  I have sent refills and the new medicine, Trazodone to help with your sleep. I have sent over a referral to podiatry regarding your foot pain.      PLEASE NOTE:  If you had any lab tests please let us know if you have not heard back within a few days. You may see your results on MyChart before we have a chance to review them but we will give you a call once they are reviewed by [place]. If we ordered any referrals today, please let us know if you have not heard from their office within the next week.

## 2023-03-13 NOTE — Assessment & Plan Note (Signed)
chronic, unstable TC 276, LDL 176 - CVD risk 18% started on Crestor in Jan with titration up, pt did not increase frequency, taking just 3d/week rechecking lipids today advised on taking med daily, sending refill f/u 3-6 mos

## 2023-03-13 NOTE — Assessment & Plan Note (Signed)
chronic pt has had a 7lbweight gain since last visit last A1C 6.5 advised on low carb diet, increasing exercise, portion control if A1C still elevated, can discuss GLP-1 f/u 1-50mos.

## 2023-03-15 ENCOUNTER — Telehealth: Payer: Self-pay | Admitting: Family

## 2023-03-15 DIAGNOSIS — G8929 Other chronic pain: Secondary | ICD-10-CM

## 2023-03-15 NOTE — Telephone Encounter (Signed)
Pt advised to try 2 pills of the Trazodone per Dulce Sellar, NP. Pt gave a verbalized understanding. Pt also stated she needs a referral for injections for her back to Dr. Karin Golden. Pt will return to [facility] to sign medical release form.

## 2023-03-15 NOTE — Telephone Encounter (Signed)
Patient states PCP wanted to know where she got injection in her back. Patient informed me she got her injection at [facility] imaging by Dr. Paulina Fusi. He can be reached at [phone]. Patient also wanted to let PCP know that the trazodone is not helping with her sleep. It is doing the opposite.

## 2023-03-20 NOTE — Telephone Encounter (Signed)
ok to send referral using chronic back pain DX in chart - not sure who or where Dr Karin Golden is  however.

## 2023-03-21 NOTE — Telephone Encounter (Signed)
Referral placed.

## 2023-03-21 NOTE — Addendum Note (Signed)
Addended by: Candie Chroman on: 03/21/2023 03:15 PM   Modules accepted: Orders

## 2023-03-23 ENCOUNTER — Other Ambulatory Visit: Payer: Self-pay | Admitting: Family

## 2023-03-23 DIAGNOSIS — G4709 Other insomnia: Secondary | ICD-10-CM

## 2023-03-23 MED ORDER — ZOLPIDEM TARTRATE 5 MG PO TABS
5.0000 mg | ORAL_TABLET | Freq: Every evening | ORAL | 1 refills | Status: DC | PRN
Start: 2023-03-23 — End: 2023-08-20

## 2023-03-27 ENCOUNTER — Encounter: Payer: Self-pay | Admitting: Podiatry

## 2023-03-27 ENCOUNTER — Ambulatory Visit (INDEPENDENT_AMBULATORY_CARE_PROVIDER_SITE_OTHER): Payer: BC Managed Care – PPO

## 2023-03-27 ENCOUNTER — Ambulatory Visit (INDEPENDENT_AMBULATORY_CARE_PROVIDER_SITE_OTHER): Payer: BC Managed Care – PPO | Admitting: Podiatry

## 2023-03-27 DIAGNOSIS — M722 Plantar fascial fibromatosis: Secondary | ICD-10-CM

## 2023-03-27 MED ORDER — TRIAMCINOLONE ACETONIDE 40 MG/ML IJ SUSP
20.0000 mg | Freq: Once | INTRAMUSCULAR | Status: AC
Start: 2023-03-27 — End: 2023-03-27
  Administered 2023-03-27: 20 mg

## 2023-03-27 MED ORDER — CELECOXIB 200 MG PO CAPS: 200.0000 mg | ORAL_CAPSULE | Freq: Two times a day (BID) | ORAL | 3 refills | Status: DC

## 2023-03-27 MED ORDER — METHYLPREDNISOLONE 4 MG PO TBPK: ORAL_TABLET | ORAL | 0 refills | Status: DC

## 2023-03-27 NOTE — Progress Notes (Signed)
Subjective:  Patient ID: Toni Kennedy, female    DOB: Nov 04, 1965,  MRN: [id number] HPI Chief Complaint  Patient presents with   Foot Pain    Plantar foot bilateral - fells heavy in the mornings x 2 months, gets better throughout the day, aching after being on feet awhile, "I do have a fallen arch in the right foot that I got injections for years ago"    New Patient (Initial Visit)    57 y.o. female presents with the above complaint.   ROS: Denies fever chills nausea vomiting back pain chest pain shortness of breath calf pain leg pain. Past Medical History:  Diagnosis Date   Anemia    hx of   Anxiety    Arthritis    generalized   Back complaints    Blood in stool    Cardiac arrhythmia    Depression    PTSD-not on medications   Encounter for screening colonoscopy 03/27/2017   External hemorrhoid 03/27/2017   GERD (gastroesophageal reflux disease)    with certain foods   Heart murmur    Hemorrhoid    Hyperlipidemia    on meds   Hypotension    past hx    Neuromuscular disorder (HCC)    Osteoporosis    Rectal bleeding 03/27/2017   Routine adult health maintenance 03/14/2017   Scoliosis    Seasonal allergies    Substance abuse (HCC)    Past Surgical History:  Procedure Laterality Date   CESAREAN SECTION     COLONOSCOPY  2018   PATELLA FRACTURE SURGERY Left    POLYPECTOMY  2018   SSP   REPLACEMENT TOTAL HIP W/  RESURFACING IMPLANTS Right    TUBAL LIGATION     WISDOM TOOTH EXTRACTION      Current Outpatient Medications:    celecoxib (CELEBREX) 200 MG capsule, Take 1 capsule (200 mg total) by mouth 2 (two) times daily., Disp: 60 capsule, Rfl: 3   methylPREDNISolone (MEDROL DOSEPAK) 4 MG TBPK tablet, 6 day dose pack - take as directed, Disp: 21 tablet, Rfl: 0   rosuvastatin (CRESTOR) 5 MG tablet, Take 1 tablet (5 mg total) by mouth daily. START taking 1 pill on Monday, Wednesday, Friday for 2 weeks, then every other day for 2 weeks, then qd, Disp: 90 tablet, Rfl:  1   zolpidem (AMBIEN) 5 MG tablet, Take 1 tablet (5 mg total) by mouth at bedtime as needed for sleep. If no sleep after 3 nights, ok to take 2 tabs., Disp: 15 tablet, Rfl: 1  Allergies  Allergen Reactions   Sulfa Antibiotics Hives   Sulfur Nausea And Vomiting   Tetracaine Nausea And Vomiting   Tetracyclines & Related Other (See Comments)    sunlight   Review of Systems Objective:  There were no vitals filed for this visit.  General: Well developed, nourished, in no acute distress, alert and oriented x3   Dermatological: Skin is warm, dry and supple bilateral. Nails x 10 are well maintained; remaining integument appears unremarkable at this time. There are no open sores, no preulcerative lesions, no rash or signs of infection present.  Vascular: Dorsalis Pedis artery and Posterior Tibial artery pedal pulses are 2/4 bilateral with immedate capillary fill time. Pedal hair growth present. No varicosities and no lower extremity edema present bilateral.   Neruologic: Grossly intact via light touch bilateral. Vibratory intact via tuning fork bilateral. Protective threshold with Semmes Wienstein monofilament intact to all pedal sites bilateral. Patellar and Achilles deep tendon reflexes  2+ bilateral. No Babinski or clonus noted bilateral.   Musculoskeletal: No gross boney pedal deformities bilateral. No pain, crepitus, or limitation noted with foot and ankle range of motion bilateral. Muscular strength 5/5 in all groups tested bilateral.  Pes planovalgus of the right foot with mild hallux valgus deformity.  She has pain on palpation medial calcaneal tubercle of the right heel.  She also has pain to the lateral fourth fifth tarsometatarsal joint and the lesser metatarsophalangeal joints and midfoot.  Left foot demonstrates rectus foot with hallux valgus deformity and previously fractured distal metatarsals with rolled resulted in a lateral deviation of those heads.  She does have some  osteoarthritic changes at the fourth metatarsal phalangeal joint.  Gait: Unassisted, Nonantalgic.    Radiographs:  Radiographs taken today demonstrate osseously mature individual with decent bone mineralization.  Pes planovalgus of the right foot does not demonstrate any type of coalition also has osteoarthritic changes of the tarsometatarsal joints right foot.  Soft tissue increase in density of plantar fascia containing insertion site of the right foot.  Left foot demonstrates a rectus foot type with fractured metatarsal heads and necks that have gone on to heal.  She has hallux abductovalgus of this foot but otherwise arch appears to be relatively normal.  No significant rear foot pathology.  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  Lateral compensatory syndrome.  Left foot pain forefoot left.  Plan: Discussed etiology pathology conservative versus surgical therapies.  At this point I injected her right heel today 20 mg Kenalog, gross marking point of maximal tenderness.  Discussed appropriate shoe gear stretching exercise ice therapy and sugar modifications.  Recommended that she follow-up with [place] for orthotics.  Started on methylprednisolone to be followed by Celebrex she was already taking Celebrex regularly and did not have any issues with her allergies.  Will follow-up with her once her orthotics come in.  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     Jermayne Sweeney T. [place], [place]

## 2023-03-27 NOTE — Progress Notes (Signed)
Eval Fo's patient was seen by Dr Al Corpus yesterday for Plantar fasciitis BIL feet patient has pain with ambulation Right foot has more Pes planus structure as Left is more Cavus and neurtral aligment. Feet are flexible, orthotics will benefit patient by providing support with total contact to BIL MLA's that will help to distribute body weight more evenly across BIL feet and help reduce plantar pressure and pain  Scans taken today items ordered will need to be fitted when in  Addison Bailey Cped, CFo, CFm

## 2023-03-28 ENCOUNTER — Ambulatory Visit (INDEPENDENT_AMBULATORY_CARE_PROVIDER_SITE_OTHER): Payer: BC Managed Care – PPO

## 2023-03-28 DIAGNOSIS — M722 Plantar fascial fibromatosis: Secondary | ICD-10-CM | POA: Diagnosis not present

## 2023-04-13 ENCOUNTER — Telehealth: Payer: Self-pay | Admitting: Podiatry

## 2023-04-13 NOTE — Telephone Encounter (Signed)
Lmom for pt to call back to schedul picking up orthotics    Balance is [phone]

## 2023-04-26 ENCOUNTER — Ambulatory Visit: Payer: BC Managed Care – PPO

## 2023-04-26 NOTE — Progress Notes (Signed)
 Patient presents today to pick up custom molded foot orthotics, diagnosed with Plantar Fasciitis by Dr. Al Corpus.   Orthotics were dispensed and fit was satisfactory. Reviewed instructions for break-in and wear. Written instructions given to patient.  Patient will follow up as needed.   Addison Bailey CPed, CFo, CFm

## 2023-06-24 ENCOUNTER — Emergency Department (HOSPITAL_COMMUNITY)
Admission: EM | Admit: 2023-06-24 | Discharge: 2023-06-24 | Disposition: A | Discharge status: Home / Self Care | Payer: BC Managed Care – PPO | Attending: Emergency Medicine | Admitting: Emergency Medicine

## 2023-06-24 ENCOUNTER — Emergency Department (HOSPITAL_COMMUNITY): Payer: BC Managed Care – PPO

## 2023-06-24 ENCOUNTER — Encounter (HOSPITAL_COMMUNITY): Payer: Self-pay

## 2023-06-24 ENCOUNTER — Other Ambulatory Visit: Payer: Self-pay

## 2023-06-24 DIAGNOSIS — F17290 Nicotine dependence, other tobacco product, uncomplicated: Secondary | ICD-10-CM | POA: Insufficient documentation

## 2023-06-24 DIAGNOSIS — M1712 Unilateral primary osteoarthritis, left knee: Secondary | ICD-10-CM | POA: Insufficient documentation

## 2023-06-24 DIAGNOSIS — M25562 Pain in left knee: Secondary | ICD-10-CM | POA: Diagnosis present

## 2023-06-24 NOTE — Discharge Instructions (Addendum)
We evaluated you for your knee pain.  Your x-ray shows arthritis, which is caused by wear and tear over time.  Unfortunately there is no cure for this.  I would recommend following up with an orthopedic physician as they can sometimes perform a steroid injection or potentially perform a knee replacement surgery.  Please take Tylenol and Motrin for your symptoms at home.  You can take 1000 mg of Tylenol every 6 hours and 600 mg of ibuprofen every 6 hours as needed for your symptoms.  You can take these medicines together as needed, either at the same time, or alternating every 3 hours.  Please keep your leg elevated whenever possible.  You can try a soft knee brace or Ace wrap to see if this helps.  You can also apply ice or heat to help with the swelling and pain.  Please call orthopedic surgery for follow-up.  If you notice any new or worsening symptoms such as fevers or chills, increasing pain or swelling, or inability to walk, return to the emergency department for reassessment.

## 2023-06-24 NOTE — ED Triage Notes (Signed)
Pt c.o left knee pain, pt was at work when her knee gave out on her, denies falling. Hx of surgery to that knee. Pt took 800mg  ibuprofen this morning.

## 2023-06-25 NOTE — ED Provider Notes (Signed)
EMERGENCY DEPARTMENT AT [facility] Provider Note  CSN: [id number] Arrival date & time: 06/24/23 1150  Chief Complaint(s) Knee Pain  HPI Toni Kennedy is a 57 y.o. female presenting with left knee pain. Has had knee pain for some time. 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 No fall or trauma.    Past Medical History Past Medical History:  Diagnosis Date   Anemia    hx of   Anxiety    Arthritis    generalized   Back complaints    Blood in stool    Cardiac arrhythmia    Depression    PTSD-not on medications   Encounter for screening colonoscopy 03/27/2017   External hemorrhoid 03/27/2017   GERD (gastroesophageal reflux disease)    with certain foods   Heart murmur    Hemorrhoid    Hyperlipidemia    on meds   Hypotension    past hx    Neuromuscular disorder (HCC)    Osteoporosis    Rectal bleeding 03/27/2017   Routine adult health maintenance 03/14/2017   Scoliosis    Seasonal allergies    Substance abuse [facility])    Patient Active Problem List   Diagnosis Date Noted   Mixed hyperlipidemia 03/13/2023   Prediabetes 03/13/2023   Other insomnia 03/13/2023   Cervicalgia 08/07/2022   Chronic bilateral low back pain without sciatica 11/15/2017   Obesity 03/14/2017   Chondromalacia of left patellofemoral joint 02/16/2015   Tibialis posterior tendinitis 02/16/2015   Home Medication(s) Prior to Admission medications   Medication Sig Start Date End Date Taking? Authorizing Provider  celecoxib (CELEBREX) 200 MG capsule Take 1 capsule (200 mg total) by mouth 2 (two) times daily. 03/27/23   Hyatt, Max T, DPM  methylPREDNISolone (MEDROL DOSEPAK) 4 MG TBPK tablet 6 day dose pack - take as directed 03/27/23   Hyatt, Max T, DPM  rosuvastatin (CRESTOR) 5 MG tablet Take 1 tablet (5 mg total) by mouth daily. START taking 1 pill on Monday, Wednesday, Friday for 2 weeks, then every other day for 2 weeks, then qd 03/13/23   Dulce Sellar, NP   zolpidem (AMBIEN) 5 MG tablet Take 1 tablet (5 mg total) by mouth at bedtime as needed for sleep. If no sleep after 3 nights, ok to take 2 tabs. 03/23/23   Dulce Sellar, NP                                                                                                                                    Past Surgical History Past Surgical History:  Procedure Laterality Date   CESAREAN SECTION     COLONOSCOPY  2018   PATELLA FRACTURE SURGERY Left    POLYPECTOMY  2018   SSP   REPLACEMENT TOTAL HIP W/  RESURFACING IMPLANTS Right    TUBAL LIGATION     WISDOM TOOTH EXTRACTION  Family History Family History  Problem Relation Age of Onset   Arthritis Mother    Hyperlipidemia Mother    Hypertension Mother    Diabetes Mother    Heart disease Father    Liver cancer Brother 59   Heart disease Maternal Grandmother    Colon cancer Neg Hx    Colon polyps Neg Hx    Esophageal cancer Neg Hx    Rectal cancer Neg Hx    Stomach cancer Neg Hx     Social History Social History   Tobacco Use   Smoking status: Some Days    Types: Cigars   Smokeless tobacco: Never  Vaping Use   Vaping status: Never Used  Substance Use Topics   Alcohol use: Never   Allergies Sulfa antibiotics, Sulfur, Tetracaine, and Tetracyclines & related  Review of Systems Review of Systems  All other systems reviewed and are negative.   Physical Exam Vital Signs  I have reviewed the triage vital signs BP (!) 152/93   Pulse 67   Temp 98.2 F (36.8 C) (Oral)   Resp 16   Ht 5\' 5"  (1.651 m)   Wt 93.4 kg   SpO2 98%   BMI 34.28 kg/m  Physical Exam Vitals and nursing note reviewed.  Constitutional:      Appearance: Normal appearance.  HENT:     Head: Normocephalic and atraumatic.     Mouth/Throat:     Mouth: Mucous membranes are moist.  Eyes:     Conjunctiva/sclera: Conjunctivae normal.  Cardiovascular:     Rate and Rhythm: Normal rate.  Pulmonary:     Effort: Pulmonary effort is normal. No  respiratory distress.  Abdominal:     General: Abdomen is flat.  Musculoskeletal:        General: No deformity.     Comments: FROM left knee w/o focal tenderness. Possible trace effusion. Distal DP pulse 2+. No calf ttp   Skin:    General: Skin is warm and dry.     Capillary Refill: Capillary refill takes less than 2 seconds.  Neurological:     General: No focal deficit present.     Mental Status: She is alert. Mental status is at baseline.  Psychiatric:        Mood and Affect: Mood normal.        Behavior: Behavior normal.     ED Results and Treatments Labs (all labs ordered are listed, but only abnormal results are displayed) Labs Reviewed - No data to display                                                                                                                        Radiology DG Knee Complete 4 Views Left  Result Date: 06/24/2023 CLINICAL DATA:  Knee pain EXAM: LEFT KNEE - COMPLETE 4+ VIEW COMPARISON:  05/03/2021 FINDINGS: No fracture or malalignment. Chronic postsurgical and posttraumatic changes of the patella. Tricompartment arthritis of the knee with moderate advanced degenerative changes of the  medial and patellofemoral joint spaces. Trace knee effusion. IMPRESSION: Tricompartment arthritis with moderate to advanced degenerative changes of the medial and patellofemoral joint spaces. Probable small knee effusion Electronically Signed   By: Jasmine Pang M.D.   On: 06/24/2023 13:35    Pertinent labs & imaging results that were available during my care of the patient were reviewed by me and considered in my medical decision making (see MDM for details).  Medications Ordered in ED Medications - No data to display                                                                                                                                   Procedures Procedures  (including critical care time)  Medical Decision Making / ED Course   MDM:  57 y/o with knee  pain  XR with osteoarthritis. Suspect this is cause. Patient has chronic knee pain. Had prior patellar surgery which may be contributing to progression. Doubt septic joint or occult fracture. Discussed self care and recommended f/u with orthopedics for further management       Additional history obtained: -Additional history obtained from spouse    Imaging Studies ordered: I ordered imaging studies including XR knee On my interpretation imaging demonstrates osteoarthritis  I independently visualized and interpreted imaging. I agree with the radiologist interpretation   Medicines ordered and prescription drug management: No orders of the defined types were placed in this encounter.   -I have reviewed the patients home medicines and have made adjustments as needed   Social Determinants of Health:  Diagnosis or treatment significantly limited by social determinants of health: obesity   Reevaluation: After the interventions noted above, I reevaluated the patient and found that their symptoms have improved  Co morbidities that complicate the patient evaluation  Past Medical History:  Diagnosis Date   Anemia    hx of   Anxiety    Arthritis    generalized   Back complaints    Blood in stool    Cardiac arrhythmia    Depression    PTSD-not on medications   Encounter for screening colonoscopy 03/27/2017   External hemorrhoid 03/27/2017   GERD (gastroesophageal reflux disease)    with certain foods   Heart murmur    Hemorrhoid    Hyperlipidemia    on meds   Hypotension    past hx    Neuromuscular disorder (HCC)    Osteoporosis    Rectal bleeding 03/27/2017   Routine adult health maintenance 03/14/2017   Scoliosis    Seasonal allergies    Substance abuse (HCC)       Dispostion: Disposition decision including need for hospitalization was considered, and patient discharged from emergency department.    Final Clinical Impression(s) / ED Diagnoses Final  diagnoses:  Primary osteoarthritis of left knee     This chart was dictated using voice recognition software.  Despite best efforts to proofread,  errors  can occur which can change the documentation meaning.    Lonell Grandchild, MD 06/25/23 [phone]

## 2023-07-25 ENCOUNTER — Other Ambulatory Visit: Payer: Self-pay | Admitting: Family

## 2023-07-25 DIAGNOSIS — E782 Mixed hyperlipidemia: Secondary | ICD-10-CM

## 2023-08-03 ENCOUNTER — Ambulatory Visit (HOSPITAL_COMMUNITY)
Admission: EM | Admit: 2023-08-03 | Discharge: 2023-08-03 | Disposition: A | Discharge status: Home / Self Care | Payer: BC Managed Care – PPO | Attending: Family Medicine | Admitting: Family Medicine

## 2023-08-03 ENCOUNTER — Encounter (HOSPITAL_COMMUNITY): Payer: Self-pay

## 2023-08-03 DIAGNOSIS — J069 Acute upper respiratory infection, unspecified: Secondary | ICD-10-CM

## 2023-08-03 LAB — POC COVID19/FLU A&B COMBO
Covid Antigen, POC: NEGATIVE
Influenza A Antigen, POC: NEGATIVE
Influenza B Antigen, POC: NEGATIVE

## 2023-08-03 NOTE — ED Triage Notes (Signed)
Patient here today with c/o nasal drainage and scratchy throat since yesterday. Patient states that a coworker tested positive for Covid. Patient was told by her employer that she should get tested before coming back to work.

## 2023-08-03 NOTE — ED Provider Notes (Signed)
[facility]    CSN: [id number] Arrival date & time: 08/03/23  0901      History   Chief Complaint Chief Complaint  Patient presents with   Nasal Congestion    HPI Toni Kennedy is a 57 y.o. female.   Patient is here for covid testing.  Sinus congestion, scratchy throat since yesterday.  Exposed to covid while at work.  No fevers/chills.  No wheezing or sob.  No otc meds taken.        Past Medical History:  Diagnosis Date   Anemia    hx of   Anxiety    Arthritis    generalized   Back complaints    Blood in stool    Cardiac arrhythmia    Depression    PTSD-not on medications   Encounter for screening colonoscopy 03/27/2017   External hemorrhoid 03/27/2017   GERD (gastroesophageal reflux disease)    with certain foods   Heart murmur    Hemorrhoid    Hyperlipidemia    on meds   Hypotension    past hx    Neuromuscular disorder (HCC)    Osteoporosis    Rectal bleeding 03/27/2017   Routine adult health maintenance 03/14/2017   Scoliosis    Seasonal allergies    Substance abuse (HCC)     Patient Active Problem List   Diagnosis Date Noted   Mixed hyperlipidemia 03/13/2023   Prediabetes 03/13/2023   Other insomnia 03/13/2023   Cervicalgia 08/07/2022   Chronic bilateral low back pain without sciatica 11/15/2017   Obesity 03/14/2017   Chondromalacia of left patellofemoral joint 02/16/2015   Tibialis posterior tendinitis 02/16/2015    Past Surgical History:  Procedure Laterality Date   CESAREAN SECTION     COLONOSCOPY  2018   PATELLA FRACTURE SURGERY Left    POLYPECTOMY  2018   SSP   REPLACEMENT TOTAL HIP W/  RESURFACING IMPLANTS Right    TUBAL LIGATION     WISDOM TOOTH EXTRACTION      OB History     Gravida  3   Para  2   Term      Preterm      AB  1   Living  2      SAB      IAB      Ectopic      Multiple      Live Births               Home Medications    Prior to Admission medications    Medication Sig Start Date End Date Taking? Authorizing Provider  gabapentin (NEURONTIN) 100 MG capsule Take 100 mg by mouth 3 (three) times daily. 06/17/23  Yes [provider]  rosuvastatin (CRESTOR) 5 MG tablet TAKE 1 TABLET BY MOUTH ON MONDAY, WEDNESDAY AND FRIDAY FOR 2 WEEKS THEN EVERY OTHER DAY FOR 2 WEEKS THEN DAILY 07/25/23   Dulce Sellar, NP  zolpidem (AMBIEN) 5 MG tablet Take 1 tablet (5 mg total) by mouth at bedtime as needed for sleep. If no sleep after 3 nights, ok to take 2 tabs. 03/23/23   Dulce Sellar, NP    Family History Family History  Problem Relation Age of Onset   Arthritis Mother    Hyperlipidemia Mother    Hypertension Mother    Diabetes Mother    Heart disease Father    Liver cancer Brother 40   Heart disease Maternal Grandmother    Colon cancer Neg Hx  Colon polyps Neg Hx    Esophageal cancer Neg Hx    Rectal cancer Neg Hx    Stomach cancer Neg Hx     Social History Social History   Tobacco Use   Smoking status: Some Days    Types: Cigars   Smokeless tobacco: Never  Vaping Use   Vaping status: Never Used  Substance Use Topics   Alcohol use: Never     Allergies   Sulfa antibiotics, Sulfur, Tetracaine, and Tetracyclines & related   Review of Systems Review of Systems  Constitutional: Negative.   HENT:  Positive for congestion, rhinorrhea and sore throat.   Respiratory:  Negative for cough.   Cardiovascular: Negative.   Gastrointestinal: Negative.   Musculoskeletal: Negative.   Psychiatric/Behavioral: Negative.       Physical Exam Triage Vital Signs ED Triage Vitals  Encounter Vitals Group     BP 08/03/23 0922 (!) 136/94     Systolic BP Percentile --      Diastolic BP Percentile --      Pulse Rate 08/03/23 0922 64     Resp 08/03/23 0922 16     Temp 08/03/23 0922 98.6 F (37 C)     Temp Source 08/03/23 0922 Oral     SpO2 08/03/23 0922 97 %     Weight 08/03/23 0922 208 lb (94.3 kg)     Height 08/03/23 0922 5'  4" (1.626 m)     Head Circumference --      Peak Flow --      Pain Score 08/03/23 0921 0     Pain Loc --      Pain Education --      Exclude from Growth Chart --    No data found.  Updated Vital Signs BP (!) 136/94   Pulse 64   Temp 98.6 F (37 C) (Oral)   Resp 16   Ht 5\' 4"  (1.626 m)   Wt 94.3 kg   LMP  (LMP Unknown)   SpO2 97%   BMI 35.70 kg/m   Visual Acuity Right Eye Distance:   Left Eye Distance:   Bilateral Distance:    Right Eye Near:   Left Eye Near:    Bilateral Near:     Physical Exam Constitutional:      General: She is not in acute distress.    Appearance: Normal appearance. She is not ill-appearing.  HENT:     Nose: Congestion and rhinorrhea present.  Cardiovascular:     Rate and Rhythm: Normal rate and regular rhythm.  Pulmonary:     Effort: Pulmonary effort is normal.     Breath sounds: Normal breath sounds.  Musculoskeletal:     Cervical back: Normal range of motion and neck supple. No tenderness.  Skin:    General: Skin is warm.  Neurological:     General: No focal deficit present.     Mental Status: She is alert.  Psychiatric:        Mood and Affect: Mood normal.      UC Treatments / Results  Labs (all labs ordered are listed, but only abnormal results are displayed) Labs Reviewed  POC COVID19/FLU A&B COMBO    EKG   Radiology No results found.  Procedures Procedures (including critical care time)  Medications Ordered in UC Medications - No data to display  Initial Impression / Assessment and Plan / UC Course  I have reviewed the triage vital signs and the nursing notes.  Pertinent labs &  imaging results that were available during my care of the patient were reviewed by me and considered in my medical decision making (see chart for details).   Final Clinical Impressions(s) / UC Diagnoses   Final diagnoses:  Upper respiratory tract infection, unspecified type     Discharge Instructions      You were seen today  for upper respiratory symptoms.  Your flu/covid testing was negative today.  I recommend over the counter medications like sudafed, zyrtec/claritin.  Please return if feeling worse.     ED Prescriptions   None    PDMP not reviewed this encounter.   Jannifer Franklin, MD 08/03/23 1000

## 2023-08-03 NOTE — Discharge Instructions (Signed)
You were seen today for upper respiratory symptoms.  Your flu/covid testing was negative today.  I recommend over the counter medications like sudafed, zyrtec/claritin.  Please return if feeling worse.

## 2023-08-20 ENCOUNTER — Ambulatory Visit: Payer: BC Managed Care – PPO | Admitting: Family

## 2023-08-20 VITALS — BP 126/90 | HR 70 | Temp 97.2°F | Ht 64.0 in | Wt 210.0 lb

## 2023-08-20 DIAGNOSIS — F3181 Bipolar II disorder: Secondary | ICD-10-CM | POA: Diagnosis not present

## 2023-08-20 DIAGNOSIS — G8929 Other chronic pain: Secondary | ICD-10-CM | POA: Insufficient documentation

## 2023-08-20 DIAGNOSIS — M25562 Pain in left knee: Secondary | ICD-10-CM | POA: Diagnosis not present

## 2023-08-20 DIAGNOSIS — G4709 Other insomnia: Secondary | ICD-10-CM

## 2023-08-20 DIAGNOSIS — M25552 Pain in left hip: Secondary | ICD-10-CM | POA: Diagnosis not present

## 2023-08-20 HISTORY — DX: Bipolar II disorder: F31.81

## 2023-08-20 MED ORDER — CELECOXIB 100 MG PO CAPS: 100.0000 mg | ORAL_CAPSULE | Freq: Two times a day (BID) | ORAL | 1 refills | Status: DC

## 2023-08-20 NOTE — Assessment & Plan Note (Signed)
Severe pain and functional limitation, previously treated with arthroscopic surgery and cortisone injections. Recent imaging shows bone-on-bone changes. Pain exacerbated by work-related activities including frequent stair climbing. Visible swelling noted. -Refer to [facility] for further evaluation and management, including consideration of Zilretta (long-acting steroid injection). -Start Celebrex for pain control, advised on use & SE. -Provide work note for absence until next Monday.

## 2023-08-20 NOTE — Assessment & Plan Note (Signed)
New - pt had eval done by disability provider pt reports having severe depression r/t her multiple joint pain & back pain causing her to not be able to work & she worries about paying her bills sending referral to psych for further eval

## 2023-08-20 NOTE — Progress Notes (Signed)
Patient ID: Toni Kennedy, female    DOB: Feb 03, 1966, 57 y.o.   MRN: [id number]  Chief Complaint  Patient presents with   Arthritis    Pt c/o left knee, hip and shoulder pain, Present for 3 weeks. Has tried ibuprofen and turmeric which did not help.        Discussed the use of AI scribe software for clinical note transcription with the patient, who gave verbal consent to proceed.  History of Present Illness   The patient, with a history of left knee surgery and right hip replacement, presents with severe left knee pain that radiates to the hip and shoulder. The pain is so severe that she is unable to work. 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 The left knee was previously injured in an accident when she was in her 45s and repaired with anchors. The patient reports that the knee is now covered with arthritis per xray done at [facility] office. She received a cortisone shot about two to three months ago, but the relief only lasted a few weeks. The patient also reports pain in the entire left leg, especially when lying on it, which wakes her up at night. The patient has tried wearing a knee brace, but it did not provide relief. The patient has previously tried meloxicam but did not provide relief for her hip pain. The patient also has a history of right hip replacement surgery, which agitated a nerve in her spine. This was managed with an injection in the back and is now resolved.  In addition to her knee pain, pt reports having a recent disability evaluation done and told she has Bipolar and that insomnia meds will not help her. She was prescribed Ambien last visit which she states did not work.    Assessment & Plan:     Left Knee Osteoarthritis - Chronic; Severe pain and functional limitation, previously treated with arthroscopic surgery and cortisone injections. Recent imaging shows bone-on-bone  changes. Pain exacerbated by work-related activities including frequent stair climbing. Visible swelling noted. -Refer to [facility] for further evaluation and management, including consideration of Zilretta (long-acting steroid injection). -Start Celebrex for pain control, advised on use & SE. -Provide work note for absence until next Monday.  Left Hip Pain - Pain radiating from the knee to the hip, causing discomfort and functional limitation. History of right hip replacement. -Refer to [facility] for further evaluation and management.   Bipolar 2 disorder, major depressive episode (HCC) Assessment & Plan: New - pt had eval done by disability provider pt reports having severe depression r/t her multiple joint pain & back pain causing her to not be able to work & she worries about paying her bills sending referral to psych for further eval  Orders: -     Ambulatory referral to Psychiatry  Left hip pain -     Ambulatory referral to Orthopedic Surgery   Subjective:    Outpatient Medications Prior to Visit  Medication Sig Dispense Refill   gabapentin (NEURONTIN) 100 MG capsule Take 100 mg by mouth 3 (three) times daily. (Patient not taking: Reported on 08/20/2023)     rosuvastatin (CRESTOR) 5 MG tablet TAKE 1 TABLET BY MOUTH ON MONDAY, WEDNESDAY AND FRIDAY FOR 2 WEEKS THEN EVERY OTHER DAY FOR 2 WEEKS THEN DAILY (Patient not taking: Reported on 08/20/2023) 30 tablet 0   zolpidem (AMBIEN) 5 MG  tablet Take 1 tablet (5 mg total) by mouth at bedtime as needed for sleep. If no sleep after 3 nights, ok to take 2 tabs. (Patient not taking: Reported on 08/20/2023) 15 tablet 1   No facility-administered medications prior to visit.   Past Medical History:  Diagnosis Date   Anemia    hx of   Anxiety    Arthritis    generalized   Back complaints    Blood in stool    Cardiac arrhythmia    Depression    PTSD-not on medications   Encounter for screening colonoscopy 03/27/2017    External hemorrhoid 03/27/2017   GERD (gastroesophageal reflux disease)    with certain foods   Heart murmur    Hemorrhoid    Hyperlipidemia    on meds   Hypotension    past hx    Neuromuscular disorder (HCC)    Osteoporosis    Rectal bleeding 03/27/2017   Routine adult health maintenance 03/14/2017   Scoliosis    Seasonal allergies    Substance abuse (HCC)    Past Surgical History:  Procedure Laterality Date   CESAREAN SECTION     COLONOSCOPY  2018   PATELLA FRACTURE SURGERY Left    POLYPECTOMY  2018   SSP   REPLACEMENT TOTAL HIP W/  RESURFACING IMPLANTS Right    TUBAL LIGATION     WISDOM TOOTH EXTRACTION     Allergies  Allergen Reactions   Sulfa Antibiotics Hives   Sulfur Nausea And Vomiting   Tetracaine Nausea And Vomiting   Tetracyclines & Related Other (See Comments)    sunlight      Objective:    Physical Exam Vitals and nursing note reviewed.  Constitutional:      Appearance: Normal appearance.  Cardiovascular:     Rate and Rhythm: Normal rate and regular rhythm.  Pulmonary:     Effort: Pulmonary effort is normal.     Breath sounds: Normal breath sounds.  Musculoskeletal:     Left knee: Swelling and bony tenderness present. Decreased range of motion. Tenderness present.  Skin:    General: Skin is warm and dry.  Neurological:     Mental Status: She is alert.  Psychiatric:        Mood and Affect: Mood normal.        Behavior: Behavior normal.    BP (!) 126/90 (BP Location: Left Arm, Patient Position: Sitting, Cuff Size: Normal)   Pulse 70   Temp (!) 97.2 F (36.2 C) (Temporal)   Ht 5\' 4"  (1.626 m)   Wt 210 lb (95.3 kg)   LMP  (LMP Unknown)   SpO2 96%   BMI 36.05 kg/m  Wt Readings from Last 3 Encounters:  08/20/23 210 lb (95.3 kg)  08/03/23 208 lb (94.3 kg)  06/24/23 206 lb (93.4 kg)      Dulce Sellar, NP

## 2023-09-12 ENCOUNTER — Ambulatory Visit: Payer: Self-pay | Admitting: Family

## 2023-09-12 NOTE — Telephone Encounter (Deleted)
Duplicate. Patient already triaged and message forwarded to PCP for review.  Copied from CRM [id number]. Topic: Clinical - Medication Question >> Sep 12, 2023 12:26 PM Theodis Sato wrote: Reason for Triage: Patient would like to inform Dulce Sellar that celecoxib (CELEBREX) 100 MG capsule is not helping alleviate her symptoms at all and is requesting a different prescription that she could try.

## 2023-09-12 NOTE — Telephone Encounter (Signed)
The patient reported that she has been taking Celebrex since her last appointment 08/20/23 but it is not helping her arthritis pain as it is bone on bone left knee.  [facility] did x-rays.  She is waiting for her insurance company to authorize coverage for an injection.  Her job at the [employer] office requires her to walk up and down steps every 30 minutes and she is in pain and limping.  She's unable to sleep well at night.  She is requesting a different prescription that is more helpful for her pain.  She declined an office visit stating she was recently seen and it is known what the issue is.  She is requesting a new prescription as soon as possible as her next shift to work is Friday.   Reason for Disposition  [1] Caller has URGENT medicine question about med that PCP or specialist prescribed AND [2] triager unable to answer question  Answer Assessment - Initial Assessment Questions 1. NAME of MEDICINE: "What medicine(s) are you calling about?"     Celebrex 2. QUESTION: "What is your question?" (e.g., double dose of medicine, side effect)     Medication is not helping left knee pain.  Requesting a different prescription 3. PRESCRIBER: "Who prescribed the medicine?" Reason: if prescribed by specialist, call should be referred to that group.     Dulce Sellar, NP 4. SYMPTOMS: "Do you have any symptoms?" If Yes, ask: "What symptoms are you having?"  "How bad are the symptoms (e.g., mild, moderate, severe)     Pain and limping; unable to sleep  Protocols used: Medication Question Call-A-AH

## 2023-09-12 NOTE — Telephone Encounter (Signed)
Copied From CRM [id number]. Reason for Triage: Patient would like to inform Stephanie Hudnell that celecoxib (CELEBREX) 100 MG capsule is not helping alleviate her symptoms at all and is requesting a different prescription that she could try.    Chief Complaint: Medication not helping Symptoms: persistent knee pain Frequency: intermittent Pertinent Negatives: Patient denies new injuries Disposition: [] ED /[] Urgent Care (no appt availability in office) / [] Appointment(In office/virtual)/ []  [facility]/ [] Home Care/ [] Refused Recommended Disposition /[] [place] [facility]/ [x]  Follow-up with PCP Additional Notes: Patient currently taking Celebrex, states that dose was increased to 200mg  by Orthopedic doctor. Pt reports that she still has pain. 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 Pt is awaiting insurance approval to start getting injections in knee, but would like something else for pain. Pt states that she took a co-workers 800mg  Ibuprofen and felt better and would like to explore that as an options and/or Meloxicam as well.   Reason for Disposition  [1] Follow-up call from patient regarding patient's clinical status AND [2] information NON-URGENT  Answer Assessment - Initial Assessment Questions 1. REASON FOR CALL or QUESTION: "What is your reason for calling today?" or "How can I best help you?" or "What question do you have that I can help answer?"     Celebrex not helping symptoms, was seen by Orthopedic doctor and advised to take 200 mg and pain not getting any better. Pt sts that she was and on her feet all day for 12 shifts.    2. CALLER: Document the source of call. (e.g., laboratory, patient).     Patient  Protocols used: PCP Call - No Triage-A-AH

## 2023-09-14 NOTE — Telephone Encounter (Signed)
She can increase the Celebrex to 2 pills twice a day - ok to send more if needed, thx

## 2023-09-17 NOTE — Telephone Encounter (Signed)
When is her injection? and what dates?

## 2023-10-29 ENCOUNTER — Telehealth: Payer: Self-pay | Admitting: Family

## 2023-10-29 ENCOUNTER — Ambulatory Visit (HOSPITAL_COMMUNITY): Payer: Self-pay | Admitting: Student

## 2023-10-29 NOTE — Telephone Encounter (Signed)
 [facility] faxed document Surgical Clearance, to be filled out by provider. Patient requested to send it back via Fax within 2-days. Document is located in providers tray at front office.Please advise at  [phone].

## 2023-10-30 NOTE — Telephone Encounter (Signed)
 Copied from CRM [id number]. Topic: General - Other >> Oct 29, 2023  3:16 PM Almira Coaster wrote: Reason for CRM: [facility] is faxing a surgery clearance to the office for a total left knee replacement. They originally faxed it on 10/16/2023 but CAL state they have not received it. Their best call back number is [phone] . Please update once it's received and turnaround time.    I returned call from Lurena Joiner at [facility], Fax has been received and placed on providers desk.

## 2023-11-07 ENCOUNTER — Ambulatory Visit (INDEPENDENT_AMBULATORY_CARE_PROVIDER_SITE_OTHER): Admitting: Family

## 2023-11-07 ENCOUNTER — Encounter: Payer: Self-pay | Admitting: Family

## 2023-11-07 VITALS — BP 129/84 | HR 62 | Temp 97.0°F | Ht 64.0 in | Wt 207.0 lb

## 2023-11-07 DIAGNOSIS — M2242 Chondromalacia patellae, left knee: Secondary | ICD-10-CM

## 2023-11-07 DIAGNOSIS — M25562 Pain in left knee: Secondary | ICD-10-CM

## 2023-11-07 DIAGNOSIS — Z01818 Encounter for other preprocedural examination: Secondary | ICD-10-CM | POA: Diagnosis not present

## 2023-11-07 DIAGNOSIS — G8929 Other chronic pain: Secondary | ICD-10-CM

## 2023-11-07 NOTE — Progress Notes (Signed)
 Patient ID: Toni Kennedy, female    DOB: 09-09-1965, 58 y.o.   MRN: [id number]  Chief Complaint  Patient presents with   Chronic pain of left knee       Discussed the use of AI scribe software for clinical note transcription with the patient, who gave verbal consent to proceed.  History of Present Illness   Toni Kennedy is a 58 year old female with knee osteoarthritis who presents for surgical clearance for knee replacement surgery. She was referred by [facility] for surgical clearance for knee replacement. She experiences significant knee pain due to osteoarthritis, described as 'bone on bone', with no relief from medications or gel injections. The pain has severely impacted her ability to work, leading to job loss due to missed days. She has previously had a hip replacement by the same surgeon and hoping for good outcome as before, She anticipates a more intensive rehabilitation for the knee, expected to last eight weeks. She is currently taking gabapentin, which is not effective for her current pain but may help with postoperative nerve pain.     Assessment & Plan:     Knee Osteoarthritis/Pre-op exam  - Significant pain from knee osteoarthritis, unresponsive to medications or gel injections, necessitating surgical intervention. She understands the need for postoperative rehabilitation and potential risks of narcotics. Preop labs to be done by surgeon's office. - Completed preoperative examination and clearance form signed and faxed. - Advise starting stool softeners or Miralax preoperatively to prevent constipation from postoperative narcotics and gabapentin, ask surgeon, but usually given RX prior to surgery. -Try OTC Diclofenac sodium gel, apply small amount to knee 4x/day for pain. Sample provided. - Educated on the importance of postoperative rehabilitation to prevent stiffness and ongoing pain.     Preop examination (Primary) - pt is cleared for surgery, no labs  required, BP and all VS are stable.  Subjective:    Outpatient Medications Prior to Visit  Medication Sig Dispense Refill   gabapentin (NEURONTIN) 100 MG capsule Take 100 mg by mouth as needed (pain).     celecoxib (CELEBREX) 100 MG capsule Take 1 capsule (100 mg total) by mouth 2 (two) times daily. (Patient not taking: Reported on 11/07/2023) 60 capsule 1   No facility-administered medications prior to visit.   Past Medical History:  Diagnosis Date   Anemia    hx of   Anxiety    Arthritis    generalized   Back complaints    Blood in stool    Cardiac arrhythmia    Depression    PTSD-not on medications   Encounter for screening colonoscopy 03/27/2017   External hemorrhoid 03/27/2017   GERD (gastroesophageal reflux disease)    with certain foods   Heart murmur    Hemorrhoid    Hyperlipidemia    on meds   Hypotension    past hx    Neuromuscular disorder (HCC)    Osteoporosis    Rectal bleeding 03/27/2017   Routine adult health maintenance 03/14/2017   Scoliosis    Seasonal allergies    Substance abuse (HCC)    Past Surgical History:  Procedure Laterality Date   CESAREAN SECTION     COLONOSCOPY  2018   PATELLA FRACTURE SURGERY Left    POLYPECTOMY  2018   SSP   REPLACEMENT TOTAL HIP W/  RESURFACING IMPLANTS Right    TUBAL LIGATION     WISDOM TOOTH EXTRACTION     Allergies  Allergen Reactions   Sulfa  Antibiotics Hives   Sulfur Nausea And Vomiting   Tetracaine Nausea And Vomiting   Tetracyclines & Related Other (See Comments)    sunlight      Objective:    Physical Exam Vitals and nursing note reviewed.  Constitutional:      Appearance: Normal appearance.  Cardiovascular:     Rate and Rhythm: Normal rate and regular rhythm.  Pulmonary:     Effort: Pulmonary effort is normal.     Breath sounds: Normal breath sounds.  Musculoskeletal:     Left knee: Swelling and bony tenderness present. No erythema. Decreased range of motion. Tenderness present.   Skin:    General: Skin is warm and dry.  Neurological:     Mental Status: She is alert.  Psychiatric:        Mood and Affect: Mood normal.        Behavior: Behavior normal.    BP 129/84 (BP Location: Left Arm, Patient Position: Sitting, Cuff Size: Large)   Pulse 62   Temp (!) 97 F (36.1 C) (Temporal)   Ht 5\' 4"  (1.626 m)   Wt 207 lb (93.9 kg)   SpO2 94%   BMI 35.53 kg/m  Wt Readings from Last 3 Encounters:  11/07/23 207 lb (93.9 kg)  08/20/23 210 lb (95.3 kg)  08/03/23 208 lb (94.3 kg)      Dulce Sellar, NP

## 2023-11-12 ENCOUNTER — Encounter (HOSPITAL_COMMUNITY): Payer: Self-pay | Admitting: Student

## 2023-11-12 ENCOUNTER — Ambulatory Visit (HOSPITAL_BASED_OUTPATIENT_CLINIC_OR_DEPARTMENT_OTHER): Payer: Self-pay | Admitting: Student

## 2023-11-12 VITALS — BP 120/76 | HR 69 | Ht 64.0 in

## 2023-11-12 DIAGNOSIS — F320 Major depressive disorder, single episode, mild: Secondary | ICD-10-CM

## 2023-11-12 DIAGNOSIS — R29818 Other symptoms and signs involving the nervous system: Secondary | ICD-10-CM | POA: Diagnosis not present

## 2023-11-12 DIAGNOSIS — F4322 Adjustment disorder with anxiety: Secondary | ICD-10-CM
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
## 2023-11-12 NOTE — Patient Instructions (Signed)
 Go to [employer] , Call 1-800-QUIT-NOW (3105832085); or Text: Ready to 34191. [employer]uit[employer] [place] has teamed up with Medicaid to offer a free 14-week program    Referrals placed for the following: Intensive Outpatient Program and Sleep Medicine for Sleep Study.    Sleep Hygiene:   Stick to a sleep schedule of the same bedtime and wake time even on the weekends, which can help to regulate your body's internal clock so that you fall asleep and stay asleep.   Practice a relaxing bedtime ritual (conducted away from bright lights) which will help separate your sleep from stimulating activities and prepare your body to fall asleep when you go to bed.   Avoid naps, especially in the afternoon.   Evaluate your room and create conditions that will promote sleep such as keeping it cool (between 60 - 67 degrees), quiet, and free from any lights. Consider using blackout curtains, a "white noise" generator, or fan that will help mask any noises that might prevent you from going to sleep or awaken you during the night.   Sleep on a comfortable mattress and pillows.   Avoid bright light in the evening and excessive use of portable electronic devices right before bed that may contain light frequencies that can contribute to sleep problems.   Avoid alcohol, cigarettes, or heavy meals in the evening. If you must eat, consume a light snack 45 minutes before bed.   Use your bed only for sleep to strengthen the association between your bed and sleep. If you can't go to sleep within 30 minutes, go into another room and do something relaxing until you feel tired. Then, come back and try to go to sleep again for 30 minutes and repeat until sleep is achieved.

## 2023-11-13 ENCOUNTER — Telehealth (HOSPITAL_COMMUNITY): Payer: Self-pay | Admitting: Psychiatry

## 2023-11-13 NOTE — Telephone Encounter (Signed)
 D:  Dr. Alfonse Flavors referred pt to virtual MH-IOP.  A:  Oriented pt.  Pt states she wasn't aware that it is everyday.  "I will be having surgery, but I have PT and other appointments during the week."  Case manager inquired if she can schedule the appts in the afternoon?  Pt states they are all through the day.  Pt inquired about her benefits.  Redirected pt to contact [employer] to verify her benefits for virtual MH-IOP.  Pt is declining to attend at this time; states she will probably be ready in June.  Encouraged pt to call the case manager back whenever she's ready.  Inform Dr. Alfonse Flavors.  R:  Pt receptive.

## 2023-11-15 ENCOUNTER — Encounter (HOSPITAL_COMMUNITY): Payer: Self-pay | Admitting: Student

## 2023-11-15 DIAGNOSIS — F4322 Adjustment disorder with anxiety: Secondary | ICD-10-CM | POA: Insufficient documentation

## 2023-11-15 DIAGNOSIS — F320 Major depressive disorder, single episode, mild: Secondary | ICD-10-CM | POA: Insufficient documentation

## 2023-11-15 DIAGNOSIS — R29818 Other symptoms and signs involving the nervous system: Secondary | ICD-10-CM | POA: Insufficient documentation

## 2023-11-16 NOTE — Addendum Note (Signed)
 Addended by: Everlena Cooper on: 11/16/2023 11:21 AM   Modules accepted: Level of Service

## 2023-11-30 HISTORY — PX: REPLACEMENT TOTAL KNEE: SUR1224

## 2023-12-17 ENCOUNTER — Ambulatory Visit: Payer: Self-pay

## 2023-12-17 ENCOUNTER — Encounter: Payer: Self-pay | Admitting: Family Medicine

## 2023-12-17 ENCOUNTER — Ambulatory Visit: Admitting: Family Medicine

## 2023-12-17 VITALS — BP 116/79 | HR 67 | Temp 98.2°F | Resp 18 | Ht 64.0 in | Wt 205.5 lb

## 2023-12-17 DIAGNOSIS — M25562 Pain in left knee: Secondary | ICD-10-CM

## 2023-12-17 DIAGNOSIS — G8929 Other chronic pain: Secondary | ICD-10-CM

## 2023-12-17 DIAGNOSIS — R3129 Other microscopic hematuria: Secondary | ICD-10-CM | POA: Diagnosis not present

## 2023-12-17 DIAGNOSIS — M5489 Other dorsalgia: Secondary | ICD-10-CM

## 2023-12-17 DIAGNOSIS — M545 Low back pain, unspecified: Secondary | ICD-10-CM

## 2023-12-17 LAB — POC URINALSYSI DIPSTICK (AUTOMATED)
Bilirubin, UA: NEGATIVE
Blood, UA: POSITIVE
Glucose, UA: NEGATIVE
Ketones, UA: NEGATIVE
Leukocytes, UA: NEGATIVE
Nitrite, UA: NEGATIVE
Protein, UA: NEGATIVE
Spec Grav, UA: >=1.03 — AB (ref 1.010–1.025)
Urobilinogen, UA: 1 U/dL
pH, UA: 5.5 (ref 5.0–8.0)

## 2023-12-17 MED ORDER — CELECOXIB 100 MG PO CAPS: 100.0000 mg | ORAL_CAPSULE | Freq: Two times a day (BID) | ORAL | 0 refills | Status: DC

## 2023-12-17 NOTE — Patient Instructions (Addendum)
 It was very nice to see you today!  Drink more water  Heat, icey hot, tizanidine,  celebrex -sent.  Stretches.    PLEASE NOTE:  If you had any lab tests please let us  know if you have not heard back within a few days. You may see your results on MyChart before we have a chance to review them but we will give you a call once they are reviewed by us . If we ordered any referrals today, please let us  know if you have not heard from their office within the next week.   Please try these tips to maintain a healthy lifestyle:  Eat most of your calories during the day when you are active. Eliminate processed foods including packaged sweets (pies, cakes, cookies), reduce intake of potatoes, white bread, white pasta, and white rice. Look for whole grain options, oat flour or almond flour.  Each meal should contain half fruits/vegetables, one quarter protein, and one quarter carbs (no bigger than a computer mouse).  Cut down on sweet beverages. This includes juice, soda, and sweet tea. Also watch fruit intake, though this is a healthier sweet option, it still contains natural sugar! Limit to 3 servings daily.  Drink at least 1 glass of water with each meal and aim for at least 8 glasses per day  Exercise at least 150 minutes every week.

## 2023-12-17 NOTE — Telephone Encounter (Signed)
 Noted.

## 2023-12-17 NOTE — Telephone Encounter (Signed)
  Chief Complaint: right side pain that radiates to lower back Symptoms: moderate intermittent pain Frequency: since last Thursday Pertinent Negatives: Patient denies abd pain, urinary burning- stated no fever since last Wednesday Disposition: [] ED /[] Urgent Care (no appt availability in office) / [x] Appointment(In office/virtual)/ []  [facility]/ [] Home Care/ [] Refused Recommended Disposition /[]  Mobile Bus/ []  Follow-up with PCP Additional Notes: no appt with PCP today (next avail Tuesday) pt wanted to come in today. Scheduled with another provider Copied from CRM [id number]. Topic: Clinical - Red Word Triage >> Dec 17, 2023  9:35 AM Juleen Oakland F wrote: Red Word that prompted transfer to Nurse Triage: Patient had knee replacement 11/30/23 and is having severe side and back pain, believes she has an infection and requested an appt for today Reason for Disposition  [1] MODERATE back pain (e.g., interferes with normal activities) AND [2] present > 3 days  Answer Assessment - Initial Assessment Questions 1. ONSET: "When did the pain begin?"      3 days 2. LOCATION: "Where does it hurt?" (upper, mid or lower back)     Right  to back  3. SEVERITY: "How bad is the pain?"  (e.g., Scale 1-10; mild, moderate, or severe)   - MILD (1-3): Doesn't interfere with normal activities.    - MODERATE (4-7): Interferes with normal activities or awakens from sleep.    - SEVERE (8-10): Excruciating pain, unable to do any normal activities.      mod 4. PATTERN: "Is the pain constant?" (e.g., yes, no; constant, intermittent)      Comes and goes 5. RADIATION: "Does the pain shoot into your legs or somewhere else?"     no 10. OTHER SYMPTOMS: "Do you have any other symptoms?" (e.g., fever, abdomen pain, burning with urination, blood in urine)  Protocols used: Back Pain-A-AH

## 2023-12-17 NOTE — Progress Notes (Signed)
 Subjective:     Patient ID: Toni Kennedy, female    DOB: November 11, 1965, 58 y.o.   MRN: [id number]  Chief Complaint  Patient presents with   Back Pain    Lower back and side pain x 4 days    HPI H/o L knee and R hip Discussed the use of AI scribe software for clinical note transcription with the patient, who gave verbal consent to proceed.  History of Present Illness Toni Kennedy is a 58 year old female who presents with right-sided back pain following a recent left total knee replacement.  She underwent a left total knee replacement on November 30, 2023. Post-surgery, she experienced numbness in the front of her leg due to nerve involvement and describes the pain as worse than her previous hip surgery. She had a fever post-surgery, which was considered normal by her surgeon. Has resolved  Three to four days ago, she developed pain on her right side and back. The pain varies from dull to regular, is described as 'annoying', and is intermittent, occurring every one to two hours. It remains localized to the right flank without radiating down her leg or up her back. Movement and exercises have not exacerbated the pain, and she denies any recent injury or fall. She is concerned about the possibility of a blood clot. She reports darker urine but no burning during urination or blood in her urine. Not drinking a lot of water  Her current medications include hydromorphone  for pain and aspirin. She has not been taking her prescribed rosuvastatin  for cholesterol for the past two months and wants to have her cholesterol levels checked. She also takes tizanidine for muscle relaxation, which she finds more sedating than morphine. She has not been taking Celebrex , which was previously prescribed for pain management.  She reports a low-grade fever of 99.45F last Thursday but denies vomiting. She has a history of polyps and suspects hemorrhoids due to bright blood seen elsewhere.  Socially, she recently  lost her job at the [employer] due to her knee condition and is uncertain about returning to work.    Health Maintenance Due  Topic Date Due   FOOT EXAM  Never done   HIV Screening  Never done   Diabetic kidney evaluation - Urine ACR  Never done   Hepatitis C Screening  Never done   Diabetic kidney evaluation - eGFR measurement  06/24/2023   HEMOGLOBIN A1C  09/13/2023    Past Medical History:  Diagnosis Date   Anemia    hx of   Anxiety    Arthritis    generalized   Back complaints    Bipolar 2 disorder, major depressive episode (HCC) 08/20/2023   Blood in stool    Cardiac arrhythmia    Depression    PTSD-not on medications   Encounter for screening colonoscopy 03/27/2017   External hemorrhoid 03/27/2017   GERD (gastroesophageal reflux disease)    with certain foods   Heart murmur    Hemorrhoid    Hyperlipidemia    on meds   Hypotension    past hx    Neuromuscular disorder (HCC)    Osteoporosis    Rectal bleeding 03/27/2017   Routine adult health maintenance 03/14/2017   Scoliosis    Seasonal allergies    Substance abuse (HCC)     Past Surgical History:  Procedure Laterality Date   CESAREAN SECTION     COLONOSCOPY  2018   PATELLA FRACTURE SURGERY Left  POLYPECTOMY  2018   SSP   REPLACEMENT TOTAL HIP W/  RESURFACING IMPLANTS Right    REPLACEMENT TOTAL KNEE Left 11/30/2023   TUBAL LIGATION     WISDOM TOOTH EXTRACTION       Current Outpatient Medications:    aspirin EC 81 MG tablet, Take 1 tablet twice daily for 2 weeks and then once daily for 2 weeks, Disp: , Rfl:    DILAUDID  2 MG tablet, Take 2 mg by mouth every 4 (four) hours as needed., Disp: , Rfl:    tiZANidine (ZANAFLEX) 4 MG tablet, Take 4 mg by mouth every 6 (six) hours as needed for muscle spasms., Disp: , Rfl:    celecoxib  (CELEBREX ) 100 MG capsule, Take 1 capsule (100 mg total) by mouth 2 (two) times daily., Disp: 60 capsule, Rfl: 0   gabapentin  (NEURONTIN ) 100 MG capsule, Take 100  mg by mouth as needed (pain). (Patient not taking: Reported on 12/17/2023), Disp: , Rfl:    ibuprofen  (ADVIL ) 800 MG tablet, Take 800 mg by mouth 3 (three) times daily. (Patient not taking: Reported on 12/17/2023), Disp: , Rfl:    rosuvastatin  (CRESTOR ) 20 MG tablet, Take 20 mg by mouth daily. (Patient not taking: Reported on 12/17/2023), Disp: , Rfl:    traMADol  (ULTRAM ) 50 MG tablet, Take 50 mg by mouth every 6 (six) hours as needed. (Patient not taking: Reported on 12/17/2023), Disp: , Rfl:   Allergies  Allergen Reactions   Sulfa Antibiotics Hives   Sulfur Nausea And Vomiting   Tetracaine Nausea And Vomiting   Tetracycline    Tetracyclines & Related Other (See Comments)    sunlight   ROS neg/noncontributory except as noted HPI/below      Objective:     BP 116/79   Pulse 67   Temp 98.2 F (36.8 C) (Temporal)   Resp 18   Ht 5\' 4"  (1.626 m)   Wt 205 lb 8 oz (93.2 kg)   SpO2 94%   BMI 35.27 kg/m  Wt Readings from Last 3 Encounters:  12/17/23 205 lb 8 oz (93.2 kg)  11/07/23 207 lb (93.9 kg)  08/20/23 210 lb (95.3 kg)    Physical Exam   Gen: WDWN NAD HEENT: NCAT, conjunctiva not injected, sclera nonicteric CARDIAC: RRR, S1S2+, no murmur. DP 2+B LUNGS: CTAB. No wheezes ABDOMEN:  BS+, soft, NTND, No HSM, no masses EXT:  tr L edema NGE:XBMW.  Back-some muscle spasm on R flank.  No CVAT.  No rash.  Ms 5/5 RLE.  NEURO: A&O x3.  CN II-XII intact.  PSYCH: normal mood. Good eye contact   Results for orders placed or performed in visit on 12/17/23  POCT Urinalysis Dipstick (Automated)   Collection Time: 12/17/23  3:15 PM  Result Value Ref Range   Color, UA DARK YELLOW    Clarity, UA CLEAR    Glucose, UA Negative Negative   Bilirubin, UA NEG    Ketones, UA NEG    Spec Grav, UA >=1.030 (A) 1.010 - 1.025   Blood, UA POS    pH, UA 5.5 5.0 - 8.0   Protein, UA Negative Negative   Urobilinogen, UA 1.0 0.2 or 1.0 E.U./dL   Nitrite, UA NEG    Leukocytes, UA Negative Negative        Assessment & Plan:  Other acute back pain -     POCT Urinalysis Dipstick (Automated) -     Urine Culture  Chronic pain of left knee -     Celecoxib ;  Take 1 capsule (100 mg total) by mouth 2 (two) times daily.  Dispense: 60 capsule; Refill: 0  Chronic bilateral low back pain without sciatica -     Urine Culture  Microhematuria  Assessment and Plan Assessment & Plan Postoperative pain in left knee   Following her left total knee replacement on April 11, she experiences postoperative pain more severe than her previous hip surgery. Numbness in the front of the leg is present due to nerve involvement during surgery. She is currently taking hydromorphone  for pain management. Continue hydromorphone   Muscle spasm  - R back pain. She has a muscle spasm in the right flank area, likely due to compensatory mechanisms from altered gait post-knee surgery. The pain is intermittent, dull, and localized to the right flank without radiation. There are no signs of infection or blood clot. Dark urine is noted, likely due to dehydration. Muscle spasm is confirmed upon physical examination. Prescribe Celebrex  for its anti-inflammatory effect. Advise on using heat therapy and stretching exercises. Encourage increased water intake to address dehydration and monitor for any signs of rash or shingles.  Dehydration   Dehydration is likely due to inadequate fluid intake post-surgery, with dark urine observed. There is no burning on urination or blood in the urine. Encourage increased water intake and perform urinalysis to rule out infection.  Microhematuria-after pt left-called at 645pm.  Hasn't filled celebrex  yet.  Taking dilaudid  bid.  Tylenol  in between.  Advised to drink plenty of water as SG >1.030.  the hematuria micro-could be from color interference from dehydration or consider kidney stone.  Needs to sch f/u w/Stephanie to f/u on urine as well.  Let me know how doing next day or two.     Return for  soon w/stephanie for regular f/u and labs.  Ellsworth Haas, MD

## 2023-12-18 ENCOUNTER — Encounter: Payer: Self-pay | Admitting: Family Medicine

## 2023-12-18 LAB — URINE CULTURE
MICRO NUMBER:: 16383073
Result:: NO GROWTH
SPECIMEN QUALITY:: ADEQUATE

## 2023-12-18 NOTE — Progress Notes (Signed)
 Urine culture is negative.  How is the back pain?

## 2024-01-23 ENCOUNTER — Ambulatory Visit: Admitting: Family
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
## 2024-02-20 ENCOUNTER — Encounter (HOSPITAL_COMMUNITY): Payer: Self-pay

## 2024-02-20 ENCOUNTER — Ambulatory Visit (HOSPITAL_BASED_OUTPATIENT_CLINIC_OR_DEPARTMENT_OTHER)

## 2024-02-20 VITALS — BP 142/89 | HR 56 | Ht 63.6 in | Wt 207.6 lb

## 2024-02-20 DIAGNOSIS — F411 Generalized anxiety disorder: Secondary | ICD-10-CM | POA: Diagnosis not present

## 2024-02-20 DIAGNOSIS — G4700 Insomnia, unspecified: Secondary | ICD-10-CM

## 2024-02-20 MED ORDER — HYDROXYZINE PAMOATE 25 MG PO CAPS: 25.0000 mg | ORAL_CAPSULE | Freq: Three times a day (TID) | ORAL | 0 refills | Status: DC | PRN

## 2024-02-20 NOTE — Addendum Note (Signed)
 Addended by: Aquarius Latouche on: 02/20/2024 04:48 PM   Modules accepted: Orders

## 2024-02-20 NOTE — Addendum Note (Signed)
 Addended by: CARVIN CROCK on: 02/20/2024 05:01 PM   Modules accepted: Orders

## 2024-02-25 NOTE — Addendum Note (Signed)
 Addended by: CARVIN CROCK on: 02/25/2024 08:56 AM   Modules accepted: Level of Service

## 2024-03-12 NOTE — Progress Notes (Signed)
 Short Note   Patient had an appointment scheduled today, called the patient at [phone].  Patient reported that she forgot about the appointment, reported that she would call the front desk to have it rescheduled.  Toni Kennedy

## 2024-03-19 ENCOUNTER — Ambulatory Visit (INDEPENDENT_AMBULATORY_CARE_PROVIDER_SITE_OTHER): Admitting: Neurology

## 2024-03-19 ENCOUNTER — Telehealth: Payer: Self-pay | Admitting: Neurology

## 2024-03-19 ENCOUNTER — Encounter: Payer: Self-pay | Admitting: Neurology

## 2024-03-19 VITALS — BP 113/74 | HR 60 | Ht 64.0 in | Wt 209.0 lb

## 2024-03-19 DIAGNOSIS — R29818 Other symptoms and signs involving the nervous system: Secondary | ICD-10-CM

## 2024-03-19 DIAGNOSIS — F4322 Adjustment disorder with anxiety: Secondary | ICD-10-CM | POA: Diagnosis not present

## 2024-03-19 DIAGNOSIS — E66812 Obesity, class 2: Secondary | ICD-10-CM | POA: Diagnosis not present

## 2024-03-19 DIAGNOSIS — R0683 Snoring: Secondary | ICD-10-CM

## 2024-03-19 DIAGNOSIS — Z6835 Body mass index (BMI) 35.0-35.9, adult: Secondary | ICD-10-CM

## 2024-03-19 DIAGNOSIS — E6609 Other obesity due to excess calories: Secondary | ICD-10-CM

## 2024-03-19 DIAGNOSIS — R0681 Apnea, not elsewhere classified: Secondary | ICD-10-CM

## 2024-03-19 NOTE — Patient Instructions (Signed)
 Quality Sleep Information, Adult Quality sleep is important for your mental and physical health. It also improves your quality of life. Quality sleep means you: Are asleep for most of the time you are in bed. Fall asleep within 30 minutes. Wake up no more than once a night. Are awake for no longer than 20 minutes if you do wake up during the night. Most adults need 7-8 hours of quality sleep each night. How can poor sleep affect me? If you do not get enough quality sleep, you may have: Mood swings. Daytime sleepiness. Decreased alertness, reaction time, and concentration. Sleep disorders, such as insomnia and sleep apnea. Difficulty with: Solving problems. Coping with stress. Paying attention. These issues may affect your performance and productivity at work, school, and home. Lack of sleep may also put you at higher risk for accidents, suicide, and risky behaviors. If you do not get quality sleep, you may also be at higher risk for several health problems, including: Infections. Type 2 diabetes. Heart disease. High blood pressure. Obesity. Worsening of long-term conditions, like arthritis, kidney disease, depression, Parkinson's disease, and epilepsy. What actions can I take to get more quality sleep? Sleep schedule and routine Stick to a sleep schedule. Go to sleep and wake up at about the same time each day. Do not try to sleep less on weekdays and make up for lost sleep on weekends. This does not work. Limit naps during the day to 30 minutes or less. Do not take naps in the late afternoon. Make time to relax before bed. Reading, listening to music, or taking a hot bath promotes quality sleep. Make your bedroom a place that promotes quality sleep. Keep your bedroom dark, quiet, and at a comfortable room temperature. Make sure your bed is comfortable. Avoid using electronic devices that give off bright blue light for 30 minutes before bedtime. Your brain perceives bright blue light  as sunlight. This includes television, phones, and computers. If you are lying awake in bed for longer than 20 minutes, get up and do a relaxing activity until you feel sleepy. Lifestyle     Try to get at least 30 minutes of exercise on most days. Do not exercise 2-3 hours before going to bed. Do not use any products that contain nicotine or tobacco. These products include cigarettes, chewing tobacco, and vaping devices, such as e-cigarettes. If you need help quitting, ask your health care provider. Do not drink caffeinated beverages for at least 8 hours before going to bed. Coffee, tea, and some sodas contain caffeine. Do not drink alcohol or eat large meals close to bedtime. Try to get at least 30 minutes of sunlight every day. Morning sunlight is best. Medical concerns Work with your health care provider to treat medical conditions that may affect sleeping, such as: Nasal obstruction. Snoring. Sleep apnea and other sleep disorders. Talk to your health care provider if you think any of your prescription medicines may cause you to have difficulty falling or staying asleep. If you have sleep problems, talk with a sleep consultant. If you think you have a sleep disorder, talk with your health care provider about getting evaluated by a specialist. Where to find more information Sleep Foundation: sleepfoundation.org American Academy of Sleep Medicine: aasm.org Centers for Disease Control and Prevention (CDC): TonerPromos.no Contact a health care provider if: You have trouble getting to sleep or staying asleep. You often wake up very early in the morning and cannot get back to sleep. You have daytime sleepiness. You  have daytime sleep attacks of suddenly falling asleep and sudden muscle weakness (narcolepsy). You have a tingling sensation in your legs with a strong urge to move your legs (restless legs syndrome). You stop breathing briefly during sleep (sleep apnea). You think you have a sleep  disorder or are taking a medicine that is affecting your quality of sleep. Summary Most adults need 7-8 hours of quality sleep each night. Getting enough quality sleep is important for your mental and physical health. Make your bedroom a place that promotes quality sleep, and avoid things that may cause you to have poor sleep, such as alcohol, caffeine, smoking, or large meals. Talk to your health care provider if you have trouble falling asleep or staying asleep. This information is not intended to replace advice given to you by your health care provider. Make sure you discuss any questions you have with your health care provider. Document Revised: 11/30/2021 Document Reviewed: 11/30/2021 Elsevier Patient Education  2024 [employer]   Insomnia Insomnia is a sleep disorder that makes it difficult to fall asleep or stay asleep. Insomnia can cause fatigue, low energy, difficulty concentrating, mood swings, and poor performance at work or school. There are three different ways to classify insomnia: Difficulty falling asleep. Difficulty staying asleep. Waking up too early in the morning. Any type of insomnia can be long-term (chronic) or short-term (acute). Both are common. Short-term insomnia usually lasts for 3 months or less. Chronic insomnia occurs at least three times a week for longer than 3 months. What are the causes? Insomnia may be caused by another condition, situation, or substance, such as: Having certain mental health conditions, such as anxiety and depression. Using caffeine, alcohol, tobacco, or drugs. Having gastrointestinal conditions, such as gastroesophageal reflux disease (GERD). Having certain medical conditions. These include: Asthma. Alzheimer's disease. Stroke. Chronic pain. An overactive thyroid  gland (hyperthyroidism). Other sleep disorders, such as restless legs syndrome and sleep apnea. Menopause. Sometimes, the cause of insomnia may not be known. What  increases the risk? Risk factors for insomnia include: Gender. Females are affected more often than males. Age. Insomnia is more common as people get older. Stress and certain medical and mental health conditions. Lack of exercise. Having an irregular work schedule. This may include working night shifts and traveling between different time zones. What are the signs or symptoms? If you have insomnia, the main symptom is having trouble falling asleep or having trouble staying asleep. This may lead to other symptoms, such as: Feeling tired or having low energy. Feeling nervous about going to sleep. Not feeling rested in the morning. Having trouble concentrating. Feeling irritable, anxious, or depressed. How is this diagnosed? This condition may be diagnosed based on: Your symptoms and medical history. Your health care provider may ask about: Your sleep habits. Any medical conditions you have. Your mental health. A physical exam. How is this treated? Treatment for insomnia depends on the cause. Treatment may focus on treating an underlying condition that is causing the insomnia. Treatment may also include: Medicines to help you sleep. Counseling or therapy. Lifestyle adjustments to help you sleep better. Follow these instructions at home: Eating and drinking  Limit or avoid alcohol, caffeinated beverages, and products that contain nicotine and tobacco, especially close to bedtime. These can disrupt your sleep. Do not eat a large meal or eat spicy foods right before bedtime. This can lead to digestive discomfort that can make it hard for you to sleep. Sleep habits  Keep a sleep diary to help  you and your health care provider figure out what could be causing your insomnia. Write down: When you sleep. When you wake up during the night. How well you sleep and how rested you feel the next day. Any side effects of medicines you are taking. What you eat and drink. Make your bedroom a  dark, comfortable place where it is easy to fall asleep. Put up shades or blackout curtains to block light from outside. Use a white noise machine to block noise. Keep the temperature cool. Limit screen use before bedtime. This includes: Not watching TV. Not using your smartphone, tablet, or computer. Stick to a routine that includes going to bed and waking up at the same times every day and night. This can help you fall asleep faster. Consider making a quiet activity, such as reading, part of your nighttime routine. Try to avoid taking naps during the day so that you sleep better at night. Get out of bed if you are still awake after 15 minutes of trying to sleep. Keep the lights down, but try reading or doing a quiet activity. When you feel sleepy, go back to bed. General instructions Take over-the-counter and prescription medicines only as told by your health care provider. Exercise regularly as told by your health care provider. However, avoid exercising in the hours right before bedtime. Use relaxation techniques to manage stress. Ask your health care provider to suggest some techniques that may work well for you. These may include: Breathing exercises. Routines to release muscle tension. Visualizing peaceful scenes. Make sure that you drive carefully. Do not drive if you feel very sleepy. Keep all follow-up visits. This is important. Contact a health care provider if: You are tired throughout the day. You have trouble in your daily routine due to sleepiness. You continue to have sleep problems, or your sleep problems get worse. Get help right away if: You have thoughts about hurting yourself or someone else. Get help right away if you feel like you may hurt yourself or others, or have thoughts about taking your own life. Go to your nearest emergency room or: Call 911. Call the National Suicide Prevention Lifeline at 484 372 9916 or 988. This is open 24 hours a day. Text the Crisis  Text Line at (989)699-2930. Summary Insomnia is a sleep disorder that makes it difficult to fall asleep or stay asleep. Insomnia can be long-term (chronic) or short-term (acute). Treatment for insomnia depends on the cause. Treatment may focus on treating an underlying condition that is causing the insomnia. Keep a sleep diary to help you and your health care provider figure out what could be causing your insomnia. This information is not intended to replace advice given to you by your health care provider. Make sure you discuss any questions you have with your health care provider. Document Revised: 07/18/2021 Document Reviewed: 07/18/2021 Elsevier Patient Education  2024 [employer]   ASSESSMENT AND PLAN :  58 y.o. year old female  here with: Insomnia     1) adjustment, almost grief- the loss of her job and loss of her contact to colleagues , isolating and financially insecure , mind is racing, she has trouble initiating sleep.  Mrs. Tlatelpa also has observed that the steroids she was given for her knee pain and in the past for hip pain back problems shoulder pain etc. have kept her from sleeping well.  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  2) Loss of structure , daily routines, meals and sleep time.  That needs to be implemented.  She had pain meds after TKR and slept a bit better.   3) There is a risk of OSA , HST will be ordered. Snoring and apneas.   4) sleep hygiene instructions- the patient will be introduced cognitive shuffling, and  start to eat regularly at 6-7 Pm. In bed before mid night-  no n more TV in the bedroom,  create a  sound scape without light intrusion. Consider audio-books.   HST ordered.  I plan to follow up either personally or through our NP within 4-6 months.   CC Lucius Krabbe, NP and Carvin Arvella HERO, Md [address] [place],  [place] [address] ,  the patient should be referred  for cognitive behaviour therapy.   Discussion of sleep hygiene setting bedtime and rise time,  hot shower  before bed time, no screen light in the bedroom, the bedroom should be cool, quiet and dark. Night lights should illuminate the floor not shine into your eyes. Golden glow  light is less intrusive than blue or cold light.  Read in a book with pages, not on a device. Consider audio books and soothing  sound -scapes.

## 2024-03-19 NOTE — Telephone Encounter (Signed)
 Appointment details confirmed

## 2024-03-19 NOTE — Progress Notes (Signed)
 SLEEP MEDICINE CLINIC    Provider:  Dedra Gores, MD  Primary Care Physician:  Lucius Krabbe, NP [address] [place] [address]     Referring Provider:  Psychiatrist Carvin Arvella HERO, Md [address] [place],  [place] [address]          Chief Complaint according to patient   Patient presents with:     New Patient (Initial Visit)     Pt has had difficulty falling asleep and has to take tylenol  pm to go to sleep. She has been prescribed meds to help but the only thing that helps is tylenol  pm (tried/failed trazodone  and ambien ). She takes 2 at bedtime every night and states that she may get 3-4 hrs of sleep but something wakes her up. Never had a SS. Has been witnessed to  stop breathing in sleep and snores loudly.       HISTORY OF PRESENT ILLNESS:  Toni Kennedy is a 58 y.o. female patient who is seen upon psychiatrist's  referral on 03/19/2024 to evaluate for organic causes of insomnia, and has been snoring, witnessed apneas.   Chief concern according to patient :   I am only sleeping when I take 2 Tylenol  PM, and do this for years . Failed trazodone  and Ambien .  Has a history of anxiety and depression. I recently lost my job at [employer], had hip replacement last year and this April I had a knee replacement. January was my last day at work, just before I had worked there 29 years.   I got depressed.  My financial situation is strained.    Sleep relevant medical history: difficulties falling asleep, staying asleep , Nocturia 1 -2 time, light sleeper, hypervigilant, mind is racing- stress is  present.  Has a deviated septum but no repair.    Family medical /sleep history: No other family member on CPAP with OSA, nobody with insomnia, no sleep walkers.    Social history:  Patient is retired from [employer] and lives in a household alone.  Divorced , 2 adult  daughters,  no grandchildren.  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   ETOH use : none    ,  Caffeine intake in form of Coffee( 3-4/ am ) Soda( /) Tea ( /) or energy drinks Exercise in form of walking, PT .   Hobbies :[place].    Sleep habits are as follows: The patient's dinner time is between 5-11 PM. Any time.  The patient goes to bed at 10-11 PM and continues to sleep for 3-4 hours, wakes for one bathroom break, then gets another 2 hours of sleep.   The preferred sleep position is laterally, with the support of 1 pillow on a flat bed -Dreams are reportedly rare/.  The patient wakes up spontaneously , 7  AM is the usual rise time. She feels she gets the best sleep at 7 AM She reports not feeling refreshed or restored in AM, with symptoms such as dry mouth, no morning headaches,  and residual fatigue.  Naps are taken infrequently, unable to nap.    Review of Systems: Out of a complete 14 system review, the patient complains of only the following symptoms, and all other reviewed systems are negative.:  Fatigue, sleepiness , snoring, fragmented sleep, Insomnia   How likely are you to doze in  the following situations: 0 = not likely, 1 = slight chance, 2 = moderate chance, 3 = high chance   Sitting and Reading? Watching Television? Sitting inactive in a public place (theater or meeting)? As a passenger in a car for an hour without a break? Lying down in the afternoon when circumstances permit? Sitting and talking to someone? Sitting quietly after lunch without alcohol? In a car, while stopped for a few minutes in traffic?   Total = 0/ 24 points   FSS endorsed at 29/ 63 points.   Social History   Socioeconomic History   Marital status: Divorced    Spouse name: Not on file   Number of children: 2   Years of education: 14   Highest education level: Not on file  Occupational History   Occupation: Public relations account executive  Tobacco Use   Smoking status: Some Days    Types:  Cigars   Smokeless tobacco: Never  Vaping Use   Vaping status: Never Used  Substance and Sexual Activity   Alcohol use: Never   Drug use: Never    Comment: pt denies any substance abuse   Sexual activity: Yes    Partners: Male    Comment: 1st intercourse- 18, partners- >5, current partner- 1 yr  Other Topics Concern   Not on file  Social History Narrative   Fun/Hobby: Play sweepstakes   Denies abuse and feels safe at home.    Social Drivers of Corporate investment banker Strain: Not on file  Food Insecurity: Not on file  Transportation Needs: Not on file  Physical Activity: Not on file  Stress: Not on file  Social Connections: Not on file    Family History  Problem Relation Age of Onset   Arthritis Mother    Hyperlipidemia Mother    Hypertension Mother    Diabetes Mother    Heart disease Father    Liver cancer Brother 38   Heart disease Maternal Grandmother    Colon cancer Neg Hx    Colon polyps Neg Hx    Esophageal cancer Neg Hx    Rectal cancer Neg Hx    Stomach cancer Neg Hx     Past Medical History:  Diagnosis Date   Anemia    hx of   Anxiety    Arthritis    generalized   Back complaints    Bipolar 2 disorder, major depressive episode (HCC) 08/20/2023   Blood in stool    Cardiac arrhythmia    Depression    PTSD-not on medications   Encounter for screening colonoscopy 03/27/2017   External hemorrhoid 03/27/2017   GERD (gastroesophageal reflux disease)    with certain foods   Heart murmur    Hemorrhoid    Hyperlipidemia    on meds   Hypotension    past hx    Neuromuscular disorder (HCC)    Osteoporosis    Rectal bleeding 03/27/2017   Routine adult health maintenance 03/14/2017   Scoliosis    Seasonal allergies    Substance abuse (HCC)     Past Surgical History:  Procedure Laterality Date   CESAREAN SECTION     COLONOSCOPY  2018   PATELLA FRACTURE SURGERY Left    POLYPECTOMY  2018   SSP   REPLACEMENT TOTAL HIP W/  RESURFACING IMPLANTS  Right    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     rosuvastatin  (CRESTOR ) 20 MG tablet Take 20 mg by mouth daily.     traMADol  (ULTRAM ) 50 MG tablet Take 50 mg by mouth every 6 (six) hours as needed.     No current facility-administered medications on file prior to visit.    Allergies  Allergen Reactions   Sulfa Antibiotics Hives   Sulfur Nausea And Vomiting   Tetracaine Nausea And Vomiting   Tetracycline    Tetracyclines & Related Other (See Comments)    sunlight     DIAGNOSTIC DATA (LABS, IMAGING, TESTING) - I reviewed patient records, labs, notes, testing and imaging myself where available.  Lab Results  Component Value Date   WBC 4.8 06/23/2022   HGB 12.7 06/23/2022   HCT 40.7 06/23/2022   MCV 90.6 06/23/2022   PLT 337 06/23/2022      Component Value Date/Time   NA 138 06/23/2022 1746   K 3.7 06/23/2022 1746   CL 102 06/23/2022 1746   CO2 28 06/23/2022 1746   GLUCOSE 98 06/23/2022 1746   BUN 16 06/23/2022 1746   CREATININE 0.59 06/23/2022 1746   CALCIUM  9.3 06/23/2022 1746   PROT 8.3 (H) 06/23/2022 1746   ALBUMIN 4.3 06/23/2022 1746   AST 13 (L) 06/23/2022 1746   ALT 13 06/23/2022 1746   ALKPHOS 93 06/23/2022 1746   BILITOT 0.6 06/23/2022 1746   GFRNONAA >60 06/23/2022 1746   GFRAA >60 09/21/2017 1446   Lab Results  Component Value Date   CHOL 258 (H) 03/13/2023   HDL 57.50 03/13/2023   LDLCALC 175 (H) 08/07/2022   LDLDIRECT 170.0 03/13/2023   TRIG 210.0 (H) 03/13/2023   CHOLHDL 4 03/13/2023   Lab Results  Component Value Date   HGBA1C 6.4 03/13/2023   No results found for: VITAMINB12 Lab Results  Component Value Date   TSH 2.03 08/07/2022    PHYSICAL EXAM:  Today's Vitals   03/19/24 1441   BP: 113/74  Pulse: 60  Weight: 209 lb (94.8 kg)  Height: 5' 4 (1.626 m)   Body mass index is 35.87 kg/m.   Wt Readings from Last 3 Encounters:  03/19/24 209 lb (94.8 kg)  12/17/23 205 lb 8 oz (93.2 kg)  11/07/23 207 lb (93.9 kg)     Ht Readings from Last 3 Encounters:  03/19/24 5' 4 (1.626 m)  12/17/23 5' 4 (1.626 m)  11/07/23 5' 4 (1.626 m)      General: The patient is awake, alert and appears not in acute distress. The patient is well groomed. Head: Normocephalic, atraumatic. Neck is supple.  Mallampati 3,  neck circumference:15 inches .  Nasal airflow is patent.  OVERBITE  is  seen.  Dental status: poor , lower dentures.  Cardiovascular:  Regular rate and cardiac rhythm by pulse,  without distended neck veins. Respiratory: Lungs are clear to auscultation.  Skin:  Without evidence of ankle edema, or rash.    NEUROLOGIC EXAM: The patient is awake and alert, oriented to place and time.   Memory subjective described as intact.  Attention span & concentration ability appears normal.  Speech is fluent,  without  dysarthria, dysphonia or aphasia.  Mood and affect are appropriate.   Cranial nerves: no loss of smell or taste reported  Pupils are equal and briskly reactive to light. Funduscopic exam deferred.  Visual fields  by finger perimetry are intact. Hearing was intact to soft voice and finger rubbing.    Facial sensation intact to fine touch.  Facial motor strength is symmetric and tongue and uvula move midline.  Neck ROM : rotation, tilt and flexion extension were normal for age and shoulder shrug was symmetrical.    Motor exam:  Symmetric bulk, ton. Shoulder pain left with restricted ROM,  left Knee ( April 2025) with good ROM, and left hip with good ROM .  Normal tone without cog -wheeling, symmetric grip strength .   Sensory:  Fine touch and vibration were intact  Proprioception tested in the upper extremities was normal.   Coordination: Rapid alternating  movements in the fingers/hands were of normal speed.  The Finger-to-nose maneuver was intact without evidence of ataxia, dysmetria or tremor.   Gait and station: Patient could rise unassisted from a seated position, walked without assistive device.  Stance is of normal width/ base.  Toe and heel walk were deferred.  Deep tendon reflexes: in the  upper  extremities are symmetric and intact. and lower extremities, left knee unresponsive ( TKR)>      ASSESSMENT AND PLAN :  58 y.o. year old female  here with: Insomnia     1) Grief- the loss of her job and loss of her contact to colleagues , isolating and financially insecure , mind is racing, she has trouble initiating sleep.  Mrs. Imai also has observed that the steroids she was given for her knee pain and in the past for hip pain back problems shoulder pain etc. have kept her from sleeping well.  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  2) Loss of structure , daily routines, meals and sleep time.  That needs to be implemented.  She had pain meds after TKR and slept a bit better.   3) There is a risk of OSA , HST will be ordered. Snoring and apneas.   4) sleep hygiene instructions- the patient will be introduced cognitive shuffling, and  start to eat regularly at 6-7 Pm. In bed before mid night-  no n more TV in the bedroom,  create a  sound scape without light intrusion. Consider audio-books.  Watch your caffeine intake, nicotine intake.   HST ordered.  I plan to follow up either personally or through our NP within 4-6 months.   CC Lucius Krabbe, NP and Carvin Arvella HERO, Md [address] [place],  [place] [address] ,  the patient should be referred for cognitive behaviour therapy.   Discussion of sleep hygiene setting bedtime and rise time,  hot shower  before bed time, no screen light in the bedroom, the bedroom should be cool, quiet and dark. Night lights  should illuminate the floor not shine into your eyes. Golden glow  light is less intrusive than blue or cold light.  Read in a book with pages, not on a device. Consider audio books and soothing  sound -scapes.    After spending a total time of  45  minutes face to face and additional time for physical and neurologic examination, review of laboratory studies,  personal review of imaging studies, reports and results of other testing and review of referral information / records as far as provided in visit,   Electronically signed by: Dedra Gores, MD 03/19/2024 2:53 PM  [facility] and [facility] Sleep Board certified by The [employer] of  Sleep Medicine and Diplomate of the American Academy of Sleep Medicine. Board certified In Neurology through the ABPN, Fellow of the [employer] of Neu[employer].

## 2024-03-24 ENCOUNTER — Ambulatory Visit (HOSPITAL_BASED_OUTPATIENT_CLINIC_OR_DEPARTMENT_OTHER): Payer: Self-pay

## 2024-03-24 ENCOUNTER — Encounter (HOSPITAL_COMMUNITY): Payer: Self-pay

## 2024-03-24 DIAGNOSIS — Z91199 Patient's noncompliance with other medical treatment and regimen due to unspecified reason: Secondary | ICD-10-CM

## 2024-04-04 ENCOUNTER — Ambulatory Visit (INDEPENDENT_AMBULATORY_CARE_PROVIDER_SITE_OTHER): Admitting: Neurology

## 2024-04-04 DIAGNOSIS — F4322 Adjustment disorder with anxiety: Secondary | ICD-10-CM

## 2024-04-04 DIAGNOSIS — R0683 Snoring: Secondary | ICD-10-CM

## 2024-04-04 DIAGNOSIS — R0681 Apnea, not elsewhere classified: Secondary | ICD-10-CM

## 2024-04-04 DIAGNOSIS — E6609 Other obesity due to excess calories: Secondary | ICD-10-CM

## 2024-04-04 DIAGNOSIS — R29818 Other symptoms and signs involving the nervous system: Secondary | ICD-10-CM

## 2024-04-04 DIAGNOSIS — G4733 Obstructive sleep apnea (adult) (pediatric): Secondary | ICD-10-CM | POA: Diagnosis not present

## 2024-04-25 NOTE — Progress Notes (Signed)
 [facility]  Toni Kennedy 58 year old female Jul 11, 1966   HOME SLEEP TEST REPORT ( by Elene  mail -out device )    Study Protocol:     The SANSA single-point-of-skin-contact chest-worn sensor - an FDA cleared and DOT approved type 4 home sleep test device - measures eight physiological channels,  including blood oxygen saturation (measured via PPG [photoplethysmography]), EKG-derived heart rate, respiratory effort, chest movement (measured via accelerometer), snoring, body position, and actigraphy. The device is designed to be worn for up to 10 hours per study.    STUDY DATE: 04-04-2024  Data received :  04-25-2024   ORDERING CLINICIAN: Dedra Gores, MD  REFERRING CLINICIAN:  Psychiatrist Carvin Arvella HERO, Md [address] [place],  [place] [address]    CLINICAL INFORMATION/HISTORY: organic causes of insomnia to be ruled out    Epworth sleepiness score:  0/ 24 points   FSS endorsed at 29/ 63 points.   BMI: 35.9 kg/m  Neck Circumference: 15 inches,   Overbite .   FINDINGS: Sleep Summary:   Total Recording Time (hours, min):   The study started recording on 11:23 PM on 8 - 25 - 25 for a total sleep time of 7 hours 1 minute, sleep efficiency 82% total Sleep Time (hours, min):                                       Respiratory Indices:   Following  AASM criteria of scoring this patient's apnea hypopnea index (AHI)  was high at 38.9/h, placing this patient into the severe apnea category.                                               Positional  respiratory activity  / snoring : Snoring was present.   Oxygen Saturation  in Sleep    Oxygen Saturation (%) Mean:   Oxygen saturation was 88.2% with a nadir at 70% and a maximum of 96.4% and with 115 minutes of total time in hypoxia.  This is the time the patient spent below 89 percent oxygen saturation and is suggest severe hypoxia.                                       O2 Saturation (minutes) <89%: 115.0  minutes         Pulse Rate in Sleep : Pulse Mean (bpm): Mean heart rate in sleep was 62 bpm  , between the lowest heart rate at 51 and the maximum heart rate at 83 bpm.  Normal sinus rhythm was noted                          IMPRESSION:  This HST confirms the presence of severe sleep apnea which is presumed to be obstructive sleep apnea and associated with severe hypoxemia.  Home sleep Test have a higher rate of false positive or false negative results but hypoxia of 250 minutes out of a total sleep time of 7 hours is very concerning.   RECOMMENDATION: I would want for this patient to undergo an in-lab sleep study to confirm  this high degree of apnea and hypoxia which certainly can contribute to poor sleep quality overall. My goal is to invite the patient for an in-lab titration to CPAP.  This offers the opportunity to verify and treat hypoxia by oxygen titration if needed.   The condition of hypoxia as found here would exclude the patient from inspire device or dental device use and only positive airway pressure is a promising way to treat this constellation of signs and symptoms.   Please note that weight loss is strongly recommended and that the patient with severe sleep apnea should be able to obtain FDA approved weight loss drugs as long as other contraindicated conditions are not  present ( FDA APPROVED ZEPBOUND) .    Any Patient endorsing a high level of sleepiness should be cautioned not to drive, work at heights, or operate dangerous machinery or heavy equipment when tired or sleepy.  Review of good sleep hygiene measures took place in the initial consultation but should be revisited ( Your guide to better sleep  a publication by the NIH is a good source of information).   The referring provider will be notified of the test results.    I certify that I have reviewed the raw data recording prior to the issuance of this report in accordance with the standards of the American Academy of  Sleep Medicine (AASM).    NTERPRETING PHYSICIAN:   Dedra Gores, MD  [facility] and [facility] Sleep Board certified by The [employer] of Sleep Medicine and Diplomate of the [employer] of Sleep Medicine. Board certified In Neurology through the ABPN, Fellow of the [employer] Neu[employer].

## 2024-04-29 ENCOUNTER — Ambulatory Visit: Payer: Self-pay | Admitting: Neurology

## 2024-04-29 DIAGNOSIS — R0683 Snoring: Secondary | ICD-10-CM

## 2024-04-29 DIAGNOSIS — E6609 Other obesity due to excess calories: Secondary | ICD-10-CM

## 2024-04-29 DIAGNOSIS — G4733 Obstructive sleep apnea (adult) (pediatric): Secondary | ICD-10-CM

## 2024-04-29 DIAGNOSIS — R29818 Other symptoms and signs involving the nervous system: Secondary | ICD-10-CM

## 2024-04-29 DIAGNOSIS — R0681 Apnea, not elsewhere classified: Secondary | ICD-10-CM

## 2024-04-29 DIAGNOSIS — F4322 Adjustment disorder with anxiety: Secondary | ICD-10-CM

## 2024-04-29 NOTE — Procedures (Signed)
 [facility]  Toni Kennedy 58 year old female Jan 24, 1966   HOME SLEEP TEST REPORT ( by Elene  mail -out device )    Study Protocol:     The SANSA single-point-of-skin-contact chest-worn sensor - an FDA cleared and DOT approved type 4 home sleep test device - measures eight physiological channels,  including blood oxygen saturation (measured via PPG [photoplethysmography]), EKG-derived heart rate, respiratory effort, chest movement (measured via accelerometer), snoring, body position, and actigraphy. The device is designed to be worn for up to 10 hours per study.    STUDY DATE: 04-04-2024  Data received :  04-25-2024   ORDERING CLINICIAN: Dedra Gores, MD  REFERRING CLINICIAN:  Psychiatrist Carvin Arvella HERO, Md [address] [place],  [place] [address]    CLINICAL INFORMATION/HISTORY: organic causes of insomnia to be ruled out    Epworth sleepiness score:  0/ 24 points   FSS endorsed at 29/ 63 points.   BMI: 35.9 kg/m  Neck Circumference: 15 inches,   Overbite .   FINDINGS: Sleep Summary:   Total Recording Time (hours, min):   The study started recording on 11:23 PM on 8 - 25 - 25 for a total sleep time of 7 hours 1 minute, sleep efficiency 82% total Sleep Time (hours, min):                                       Respiratory Indices:   Following  AASM criteria of scoring this patient's apnea hypopnea index (AHI)  was high at 38.9/h, placing this patient into the severe apnea category.                                               Positional  respiratory activity  / snoring : Snoring was present.   Oxygen Saturation  in Sleep    Oxygen Saturation (%) Mean:   Oxygen saturation was 88.2% with a nadir at 70% and a maximum of 96.4% and with 115 minutes of total time in hypoxia.  This is the time the patient spent below 89 percent oxygen saturation and is suggest severe hypoxia.                                       O2 Saturation (minutes) <89%: 115.0  minutes         Pulse Rate in Sleep : Pulse Mean (bpm): Mean heart rate in sleep was 62 bpm  , between the lowest heart rate at 51 and the maximum heart rate at 83 bpm.  Normal sinus rhythm was noted                          IMPRESSION:  This HST confirms the presence of severe sleep apnea which is presumed to be obstructive sleep apnea and associated with severe hypoxemia.  Home sleep Test have a higher rate of false positive or false negative results but hypoxia of 250 minutes out of a total sleep time of 7 hours is very concerning.   RECOMMENDATION: I would want for this patient to undergo an in-lab sleep study to confirm this  high degree of apnea and hypoxia which certainly can contribute to poor sleep quality overall. My goal is to invite the patient for an in-lab titration to CPAP.  This offers the opportunity to verify and treat hypoxia by oxygen titration if needed.   The condition of hypoxia as found here would exclude the patient from inspire device or dental device use and only positive airway pressure is a promising way to treat this constellation of signs and symptoms.   Please note that weight loss is strongly recommended and that the patient with severe sleep apnea should be able to obtain FDA approved weight loss drugs as long as other contraindicated conditions are not  present ( FDA APPROVED ZEPBOUND) .    Any Patient endorsing a high level of sleepiness should be cautioned not to drive, work at heights, or operate dangerous machinery or heavy equipment when tired or sleepy.  Review of good sleep hygiene measures took place in the initial consultation but should be revisited ( Your guide to better sleep  a publication by the NIH is a good source of information).   The referring provider will be notified of the test results.    I certify that I have reviewed the raw data recording prior to the issuance of this report in accordance with the standards of the American Academy of  Sleep Medicine (AASM).    NTERPRETING PHYSICIAN:   Dedra Gores, MD  [facility] and [facility] Sleep Board certified by The [employer] of Sleep Medicine and Diplomate of the [employer] of Sleep Medicine. Board certified In Neurology through the ABPN, Fellow of the [employer] of Neurology.

## 2024-04-30 NOTE — Telephone Encounter (Signed)
-----   Message from [place] Dohmeier sent at 04/29/2024  5:27 PM EDT ----- I want her to come to the lab for oxygen and PAP titration, HST showed severe apnea and 112 minutes of hypoxia ----- Message ----- From: Chalice Saunas, MD Sent: 04/29/2024   4:40 PM EDT To: Saunas Chalice, MD

## 2024-04-30 NOTE — Telephone Encounter (Signed)
I called pt. I advised pt that Dr. Brett Fairy reviewed their sleep study results and found that has and recommends that pt be treated with a cpap. Dr. Brett Fairy recommends that pt return for a repeat sleep study in order to properly titrate the cpap and ensure a good mask fit. Pt is agreeable to returning for a titration study. I advised pt that our sleep lab will file with pt's insurance and call pt to schedule the sleep study when we hear back from the pt's insurance regarding coverage of this sleep study. Pt verbalized understanding of results. Pt had no questions at this time but was encouraged to call back if questions arise.

## 2024-05-07 ENCOUNTER — Telehealth: Payer: Self-pay | Admitting: Neurology

## 2024-05-07 NOTE — Telephone Encounter (Signed)
 LVM for pt to call back to schedule   CPAP  [employer] state no auth req

## 2024-05-08 NOTE — Telephone Encounter (Signed)
 CPAP [employer] state no auth req.  Spoke to the patient she is scheduled at [facility] for 06/09/24 at 8 pm.  Mailed packet and sent mychart.

## 2024-06-04 ENCOUNTER — Encounter: Payer: Self-pay | Admitting: Family

## 2024-06-04 ENCOUNTER — Ambulatory Visit (INDEPENDENT_AMBULATORY_CARE_PROVIDER_SITE_OTHER): Admitting: Family

## 2024-06-04 VITALS — BP 118/74 | HR 73 | Temp 97.3°F | Ht 64.0 in | Wt 207.6 lb

## 2024-06-04 DIAGNOSIS — R1012 Left upper quadrant pain: Secondary | ICD-10-CM | POA: Diagnosis not present

## 2024-06-04 DIAGNOSIS — R350 Frequency of micturition: Secondary | ICD-10-CM | POA: Diagnosis not present

## 2024-06-04 DIAGNOSIS — E66812 Obesity, class 2: Secondary | ICD-10-CM

## 2024-06-04 DIAGNOSIS — R7303 Prediabetes: Secondary | ICD-10-CM | POA: Diagnosis not present

## 2024-06-04 DIAGNOSIS — Z6835 Body mass index (BMI) 35.0-35.9, adult: Secondary | ICD-10-CM

## 2024-06-04 DIAGNOSIS — E6609 Other obesity due to excess calories: Secondary | ICD-10-CM

## 2024-06-04 LAB — POCT URINALYSIS DIPSTICK
Bilirubin, UA: NEGATIVE
Blood, UA: POSITIVE — AB
Glucose, UA: NEGATIVE
Ketones, UA: POSITIVE — AB
Leukocytes, UA: NEGATIVE
Nitrite, UA: NEGATIVE
Protein, UA: POSITIVE — AB
Spec Grav, UA: >=1.03 — AB (ref 1.010–1.025)
Urobilinogen, UA: 0.2 U/dL
pH, UA: 5.5 (ref 5.0–8.0)

## 2024-06-04 LAB — POCT GLYCOSYLATED HEMOGLOBIN (HGB A1C): Hemoglobin A1C: 6 % — AB (ref 4.0–5.6)

## 2024-06-04 NOTE — Progress Notes (Signed)
 Patient ID: Toni Kennedy, female    DOB: [birth year], 58 y.o.   MRN: [id number]  Chief Complaint  Patient presents with   Abdominal Pain    Pt c/o left sided abdominal cramping and urinary frequency, present for 2 weeks off and on. Pt states she does not drink much water. Has tried amoxicillin  from home, which did not help sx.    Prediabetes   Obesity   Sleep Apnea  Discussed the use of AI scribe software for clinical note transcription with the patient, who gave verbal consent to proceed.  History of Present Illness Toni Kennedy is a 58 year old female who presents with lower abdominal pain and urinary symptoms.  She experiences steady, cramp-like pain in the lower abdomen for about a week, without radiation or worsening with pressure. Urinary symptoms include increased frequency, darker urine with a foul odor, and no hematuria. She acknowledges poor hydration, preferring coffee over water. She has noticed a change in bowel habits over the past week, with soft stools after every meal, a change from her previous need for laxatives. She denies nausea, queasiness, or indigestion. She is trying to lose weight by increasing protein intake, resulting in ketonuria, and eats once or twice a day with unhealthy snacks.  Assessment & Plan Abdominal pain likely due to intestinal gas Complains of LUQ Pain, however, on exam pain exhibited with periumbilical palpation. Likely due to intestinal gas, related to dietary habits and high protein intake. - Recommend OTC simethicone (Gas-X) for gas relief, follow directions on box for 3-4 days. - Advise dietary modifications to reduce gas-producing foods: cabbage, broccoli, onions, beans, etc. - Encourage increased physical activity. - Advise to contact gastroenterology (established) follow-up if symptoms persist.  Increased frequency of urination Increased urination likely due to dehydration and excessive caffeine intake. No UTI evidence. - Advise  increasing water intake to at least two liters per day. - Educate on diuretic effects of caffeine and recommend reducing caffeine consumption.  Obesity Overweight with BMI over 30. Weight loss desired to reduce stress on knee and hip post-surgery. Zepbound discussed but deferred today due to gastrointestinal symptoms. - Advise dietary modifications to include more vegetables, healthy proteins, and less processed foods. - Encourage regular physical activity. - Consider Zepbound for weight loss after gastrointestinal symptoms resolve and pending insurance approval.  Prediabetes Borderline A1c level 6.0 today. Weight down 2 lbs.  - Continue low carb diet, avoid white carbs including sweets, increase cardio exercise as able. - F/U in 1 year or prn  Obstructive sleep apnea, suspected/under evaluation Suspected obstructive sleep apnea with history of apnea events. In-lab sleep study scheduled. - Proceed with scheduled in-lab sleep study for further evaluation.  Subjective:    Outpatient Medications Prior to Visit  Medication Sig Dispense Refill   diphenhydramine-acetaminophen  (TYLENOL  PM) 25-500 MG TABS tablet Take 2 tablets by mouth at bedtime as needed.     rosuvastatin  (CRESTOR ) 20 MG tablet Take 20 mg by mouth daily.     traMADol  (ULTRAM ) 50 MG tablet Take 50 mg by mouth every 6 (six) hours as needed.     No facility-administered medications prior to visit.   Past Medical History:  Diagnosis Date   Anemia    hx of   Anxiety    Arthritis    generalized   Back complaints    Bipolar 2 disorder, major depressive episode (HCC) 08/20/2023   Blood in stool    Cardiac arrhythmia    Depression  PTSD-not on medications   Encounter for screening colonoscopy 03/27/2017   External hemorrhoid 03/27/2017   GERD (gastroesophageal reflux disease)    with certain foods   Heart murmur    Hemorrhoid    Hyperlipidemia    on meds   Hypotension    past hx    Neuromuscular disorder (HCC)     Osteoporosis    Rectal bleeding 03/27/2017   Routine adult health maintenance 03/14/2017   Scoliosis    Seasonal allergies    Substance abuse (HCC)    Past Surgical History:  Procedure Laterality Date   CESAREAN SECTION     COLONOSCOPY  2018   PATELLA FRACTURE SURGERY Left    POLYPECTOMY  2018   SSP   REPLACEMENT TOTAL HIP W/  RESURFACING IMPLANTS Right    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  Constitutional:      Appearance: Normal appearance. She is obese.  Cardiovascular:     Rate and Rhythm: Normal rate and regular rhythm.  Pulmonary:     Effort: Pulmonary effort is normal.     Breath sounds: Normal breath sounds.  Musculoskeletal:        General: Normal range of motion.  Skin:    General: Skin is warm and dry.  Neurological:     Mental Status: She is alert.  Psychiatric:        Mood and Affect: Mood normal.        Behavior: Behavior normal.    BP 118/74 (BP Location: Left Arm, Patient Position: Sitting, Cuff Size: Large)   Pulse 73   Temp (!) 97.3 F (36.3 C) (Temporal)   Ht 5' 4 (1.626 m)   Wt 207 lb 9.6 oz (94.2 kg)   SpO2 98%   BMI 35.63 kg/m  Wt Readings from Last 3 Encounters:  06/04/24 207 lb 9.6 oz (94.2 kg)  03/19/24 209 lb (94.8 kg)  12/17/23 205 lb 8 oz (93.2 kg)      Lucius Krabbe, NP

## 2024-06-09 ENCOUNTER — Ambulatory Visit (INDEPENDENT_AMBULATORY_CARE_PROVIDER_SITE_OTHER): Admitting: Neurology

## 2024-06-09 DIAGNOSIS — E66812 Obesity, class 2: Secondary | ICD-10-CM

## 2024-06-09 DIAGNOSIS — G4733 Obstructive sleep apnea (adult) (pediatric): Secondary | ICD-10-CM

## 2024-06-09 DIAGNOSIS — R0681 Apnea, not elsewhere classified: Secondary | ICD-10-CM

## 2024-06-09 DIAGNOSIS — R29818 Other symptoms and signs involving the nervous system: Secondary | ICD-10-CM

## 2024-06-09 DIAGNOSIS — R0683 Snoring: Secondary | ICD-10-CM

## 2024-06-09 DIAGNOSIS — F4322 Adjustment disorder with anxiety: Secondary | ICD-10-CM

## 2024-06-18 ENCOUNTER — Ambulatory Visit: Payer: Self-pay | Admitting: Neurology

## 2024-06-18 DIAGNOSIS — G4733 Obstructive sleep apnea (adult) (pediatric): Secondary | ICD-10-CM | POA: Insufficient documentation

## 2024-06-18 DIAGNOSIS — G4709 Other insomnia: Secondary | ICD-10-CM

## 2024-06-18 DIAGNOSIS — E6609 Other obesity due to excess calories: Secondary | ICD-10-CM

## 2024-06-18 NOTE — Procedures (Signed)
 [facility] PAP TITRATION INTERPRETATION REPORT   STUDY DATE: 06/09/2024      PATIENT NAME:  Toni Kennedy         DATE OF BIRTH:  1966/03/21  PATIENT ID:  [id number]    TYPE OF STUDY:  CPAP  READING PHYSICIAN: DEDRA GORES, MD SCORING TECHNICIAN: Jesusa Haddock, RPSGT   HISTORY: This 58 year-old Female underwent a HST by Sansa, with the results of severe OSA ( AHI was 38.9/h and hypoxia for 1215 minutes of the recorded night,  nadir at 70%.  she returns for in - lab PAP therapy and to give her additional oxygen if needed.  . The Epworth Sleepiness Scale was 0 out of 24 (scores above or equal to 10 are suggestive of hypersomnolence). FSS at 29/ 63. The patient reportedly takes Tylenol  PM (benadryl) as sleep aid.   DESCRIPTION: A sleep technologist was in attendance for the duration of the recording.  Data collection, scoring, video monitoring, and reporting were performed in compliance with the AASM Manual for the Scoring of Sleep and Associated Events; (Hypopnea is scored based on the criteria listed in Section VIII D. 1b in the AASM Manual V2.6 using a 4% oxygen desaturation rule or Hypopnea is scored based on the criteria listed in Section VIII D. 1a in the AASM Manual V2.6 using 3% oxygen desaturation and /or arousal rule).  A physician certified by the American Board of Sleep Medicine reviewed each epoch of the study.  ADDITIONAL INFORMATION:  Height: 64.0 in Weight: 209 lb (BMI 35) Neck Size: 15.0 in    MEDICATIONS: Tylenol  PM taken in lab upon study set up, Crestor , Tramadol    SLEEP CONTINUITY AND SLEEP ARCHITECTURE: A ResMed FFM, F 40, and Evora were offered to the patient, who chose the [facility] FFM in standard size. CPAP titration  started at 6 cm water and went up to 9 cm water,  with a resultant AHI of 0/h and no oxygen was needed/ added.    Lights off was at 22:15: and lights on 04:43: (6.5 hours in bed). Total sleep time was 261.5 minutes  (25.0% supine;  75.0% lateral;  0.0% prone, 21.6% REM sleep), with a decreased sleep efficiency at 67.5%.   Sleep latency was increased at 44.0 minutes. Wake after sleep onset (WASO) time accounted for 82 minutes.   Of the total sleep time, the percentage of stage N1 sleep was 5.9%, stage N2 sleep was 72.5%, stage N3 sleep was 0.0%, and REM sleep was 21.6%. There were 2 Stage R periods observed on this study night, 36 awakenings (i.e. transitions to Stage W from any sleep stage), and 91.0 total stage transitions.  AROUSAL: There were 14 arousals/hour.  Of these, 4 were identified as respiratory-related arousals (0.9 /h), 0 were PLM-related arousals (0.0 /h), and 59 were non-specific arousals (13.5 /h)  RESPIRATORY MONITORING:  Based on CMS criteria (using a 4% oxygen desaturation rule for scoring hypopneas), there were 0 apneas (0 obstructive; 0 central; 0 mixed), and 3 hypopneas.  Apnea index was 0.0. Hypopnea index was 0.7.  The apnea-hypopnea index (AHI) was 0.7/h overall (0.9 supine, 0.6/h non-supine; 1.1 REM, 0.6/h in NREM). There were 0 respiratory effort-related arousals (RERAs).   Based on AASM criteria (using a 3% oxygen desaturation and /or arousal rule for scoring hypopneas), there were 0 apneas (0 obstructive; 0 central; 0 mixed), and 10 hypopneas. Apnea index was 0.0. Hypopnea index was 2.3. The apnea-hypopnea index (AHI)  was 2.3/h overall (2.7  supine, 2.4/h non-supine; 3.2 REM, 2/h in NREM). There were 0 respiratory effort-related arousals (RERAs).   Respiratory events were associated with oxyhemoglobin desaturations: Nadir during sleep at 91% from a mean of 96%. There were 0 occurrences of Cheyne Stokes breathing. OXIMETRY: Total sleep time spent at, or below 88% was 0.0 minutes, or 0.0% of total sleep time.  BODY POSITION: Duration of total sleep and percent of total sleep in their respective position is as follows: supine 65 minutes (25.0%), non-supine 196.0 minutes (75.0%); right 47  minutes (18.2%), left 148 minutes (56.8%), and prone 00 minutes (0.0%). Total supine REM sleep time was 00 minutes (0.0% of total REM sleep). EKG: The electrocardiogram documented normal sinus rhythm.   The average heart rate during sleep was 61 bpm.  The maximum heart rate during sleep was 79 bpm. The maximum heart rate during recording was 87.  LIMB MOVEMENTS: There were 0 periodic limb movements of sleep (0.0/h), of which 0 (0.0/h) were associated with an arousal.  IMPRESSION: The titration study did not indicate a high baseline AHI  nor hypoxia.  evan at 6 cm water pressure was there an AHI of 0/h- very unexpected.   1CPAP at any pressure improved the AHI to single digits, the patient tolerated 8 cm water the best, slept for 114 minutes at that pressure.   2. Total sleep time was still reduced.  The sleep efficiency did change with positive airway pressure3. No significant limb movements (PLMs) were observed.     RECOMMENDATIONS:   I like to make an unusual suggestion to this patient: Repeat HST with watch pad and confirm that the Sansa results were valid ( or not) . If you have significant apnea with or without hypoxia in this repeat test , we can then prescribe auto CPAP by ResMed at a range from 6-12 cm water with 3 cm EPR .  I will place a watch pat order for you.  Please make sure you don't have acrylic nails or nail varnish on.   DEDRA GORES,  MD

## 2024-06-20 NOTE — Telephone Encounter (Signed)
 Copied from CRM #[id number]. Topic: Clinical - Medication Question >> Jun 20, 2024 12:02 PM Brittany M wrote: Reason for CRM: Zepbound for weight loss after gastrointestinal symptoms resolve . She is feeling better and wants to start the medication. Please contact patient

## 2024-06-22 MED ORDER — TIRZEPATIDE-WEIGHT MANAGEMENT 2.5 MG/0.5ML ~~LOC~~ SOAJ: 2.5000 mg | SUBCUTANEOUS | 1 refills | Status: AC

## 2024-06-22 NOTE — Progress Notes (Signed)
 let Adrionna know I have sent in order for zepbound and will require PA - have sent request. we will let her know, thx

## 2024-06-22 NOTE — Addendum Note (Signed)
 Addended by: Loree Shehata on: 06/22/2024 09:50 AM   Modules accepted: Orders

## 2024-06-23 ENCOUNTER — Telehealth: Payer: Self-pay

## 2024-06-23 ENCOUNTER — Other Ambulatory Visit (HOSPITAL_COMMUNITY): Payer: Self-pay

## 2024-06-23 NOTE — Telephone Encounter (Signed)
 Pharmacy Patient Advocate Encounter   Received notification from Physician's Office that prior authorization for Zepbound 2.5MG /0.5ML Auto-injectors is required/requested.   Insurance verification completed.   The patient is insured through [facility]   Per test claim: The current 28 day co-pay is, $1,025.38.  No PA needed at this time. This test claim was processed through [facility]- copay amounts may vary at other pharmacies due to pharmacy/plan contracts, or as the patient moves through the different stages of their insurance plan.

## 2024-06-25 ENCOUNTER — Telehealth: Payer: Self-pay

## 2024-06-25 NOTE — Telephone Encounter (Signed)
-----   Message from [place]ola sent at 06/24/2024 12:01 PM EST ----- Regarding: RE: zepbound See below message and let Karolyn know. Only other option would be to buy directly from [facility] for $400 per month and I would send in the RX to them. Sorry, we tried. ----- Message ----- From: Deidra Bernice SAILOR, CPhT Sent: 06/23/2024  11:25 AM EST To: Corean Comment, NP Subject: RE: zepbound                                   Dr. Comment, It looks like the insurance only covered $200, the patient copay is $1,025.38. No PA needed. If she has any questions regarding copay, she should contact insurance. ----- Message ----- From: Comment Corean, NP Sent: 06/22/2024   9:49 AM EST To: Rx Prior Auth Team Subject: zepbound                                       please start PA for obesity with severe OSA, thanks.

## 2024-06-25 NOTE — Telephone Encounter (Signed)
 MyChart message sent to pt

## 2024-06-25 NOTE — Telephone Encounter (Signed)
 Patient now has medicare; insurance information has been added to patient chart.

## 2024-06-26 ENCOUNTER — Other Ambulatory Visit (HOSPITAL_COMMUNITY): Payer: Self-pay

## 2024-07-22 NOTE — Telephone Encounter (Signed)
 HST- medicare no auth req/ aetna state no auth req   Patient is scheduled at [facility] for 07/30/24 at 3:30 pm.  Mailed packet and sent mychart

## 2024-07-30 ENCOUNTER — Encounter

## 2024-07-30 DIAGNOSIS — E6609 Other obesity due to excess calories: Secondary | ICD-10-CM

## 2024-07-30 DIAGNOSIS — G4709 Other insomnia: Secondary | ICD-10-CM

## 2024-07-30 DIAGNOSIS — G4733 Obstructive sleep apnea (adult) (pediatric): Secondary | ICD-10-CM

## 2024-09-29 ENCOUNTER — Ambulatory Visit: Admitting: Family
